# Patient Record
Sex: Female | Born: 1950 | Race: White | Hispanic: No | Marital: Married | Smoking: Never smoker
Health system: Southern US, Community
[De-identification: ages and names within clinical notes are randomized; demographics above are authoritative.]

## PROBLEM LIST (undated history)

## (undated) DIAGNOSIS — R7303 Prediabetes: Secondary | ICD-10-CM

## (undated) DIAGNOSIS — D649 Anemia, unspecified: Secondary | ICD-10-CM

## (undated) DIAGNOSIS — Z8719 Personal history of other diseases of the digestive system: Secondary | ICD-10-CM

## (undated) DIAGNOSIS — F419 Anxiety disorder, unspecified: Secondary | ICD-10-CM

## (undated) DIAGNOSIS — I10 Essential (primary) hypertension: Secondary | ICD-10-CM

## (undated) DIAGNOSIS — K219 Gastro-esophageal reflux disease without esophagitis: Secondary | ICD-10-CM

## (undated) DIAGNOSIS — R109 Unspecified abdominal pain: Secondary | ICD-10-CM

## (undated) DIAGNOSIS — F32A Depression, unspecified: Secondary | ICD-10-CM

## (undated) DIAGNOSIS — I728 Aneurysm of other specified arteries: Secondary | ICD-10-CM

## (undated) DIAGNOSIS — F329 Major depressive disorder, single episode, unspecified: Secondary | ICD-10-CM

## (undated) DIAGNOSIS — E785 Hyperlipidemia, unspecified: Secondary | ICD-10-CM

## (undated) DIAGNOSIS — M1990 Unspecified osteoarthritis, unspecified site: Secondary | ICD-10-CM

## (undated) HISTORY — DX: Anemia, unspecified: D64.9

## (undated) HISTORY — DX: Aneurysm of other specified arteries: I72.8

## (undated) HISTORY — DX: Personal history of other diseases of the digestive system: Z87.19

## (undated) HISTORY — PX: CARPAL TUNNEL RELEASE: SHX101

## (undated) HISTORY — DX: Gastro-esophageal reflux disease without esophagitis: K21.9

## (undated) HISTORY — DX: Unspecified osteoarthritis, unspecified site: M19.90

## (undated) HISTORY — DX: Essential (primary) hypertension: I10

## (undated) HISTORY — DX: Hyperlipidemia, unspecified: E78.5

## (undated) HISTORY — DX: Unspecified abdominal pain: R10.9

---

## 1979-03-18 HISTORY — PX: TUBAL LIGATION: SHX77

## 1985-03-17 HISTORY — PX: ABDOMINAL HYSTERECTOMY: SHX81

## 1997-12-01 ENCOUNTER — Other Ambulatory Visit: Admission: RE | Admit: 1997-12-01 | Discharge: 1997-12-01 | Payer: Self-pay | Admitting: Obstetrics and Gynecology

## 2000-04-02 ENCOUNTER — Other Ambulatory Visit: Admission: RE | Admit: 2000-04-02 | Discharge: 2000-04-02 | Payer: Self-pay | Admitting: Obstetrics and Gynecology

## 2002-04-15 ENCOUNTER — Encounter: Admission: RE | Admit: 2002-04-15 | Discharge: 2002-04-15 | Payer: Self-pay | Admitting: Unknown Physician Specialty

## 2002-04-15 ENCOUNTER — Encounter: Payer: Self-pay | Admitting: Unknown Physician Specialty

## 2003-04-12 ENCOUNTER — Other Ambulatory Visit: Admission: RE | Admit: 2003-04-12 | Discharge: 2003-04-12 | Payer: Self-pay | Admitting: Obstetrics and Gynecology

## 2003-09-28 ENCOUNTER — Ambulatory Visit (HOSPITAL_BASED_OUTPATIENT_CLINIC_OR_DEPARTMENT_OTHER): Admission: RE | Admit: 2003-09-28 | Discharge: 2003-09-28 | Payer: Self-pay | Admitting: Orthopedic Surgery

## 2003-09-28 ENCOUNTER — Ambulatory Visit (HOSPITAL_COMMUNITY): Admission: RE | Admit: 2003-09-28 | Discharge: 2003-09-28 | Payer: Self-pay | Admitting: Orthopedic Surgery

## 2007-05-28 ENCOUNTER — Ambulatory Visit (HOSPITAL_BASED_OUTPATIENT_CLINIC_OR_DEPARTMENT_OTHER): Admission: RE | Admit: 2007-05-28 | Discharge: 2007-05-28 | Payer: Self-pay | Admitting: Orthopedic Surgery

## 2008-07-15 IMAGING — US MAMMO-BILAT-US
1 series · 14 of 16 positions shown · non-contrast
Comparison: NONE

CLINICAL DATA: Vicenta Duce.(FEBRES)([place])   Diagnostic Mammogram. 

LEFT BREAST MAMMOGRAM ADDITIONAL VIEWS AND LEFT BREAST ULTRASOUND 
WITH  TECHNICAL REPEAT OF THE RIGHT MLO VIEW

[Series 1: us left breast · 0.06mm/px · 14 of 17 slices shown]
[im 1/17]
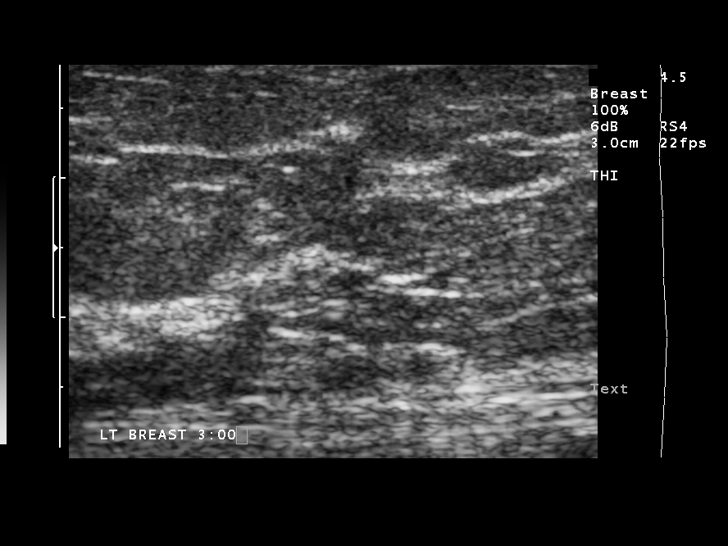
[im 2/17]
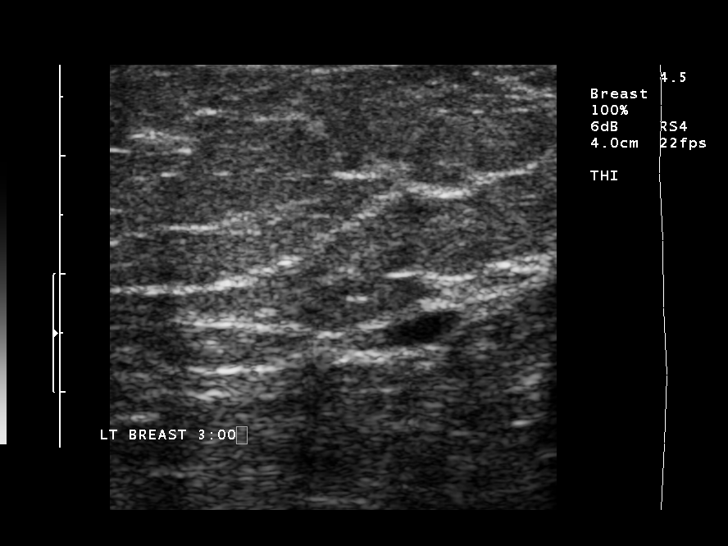
[im 3/17]
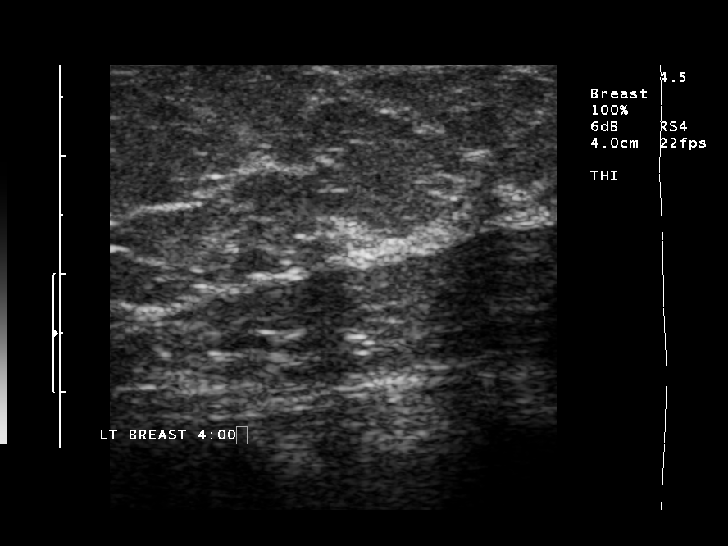
[im 5/17]
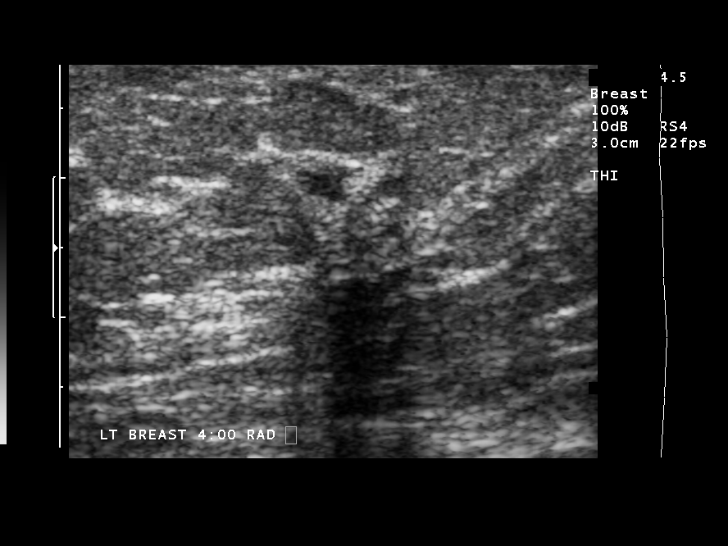
[im 6/17]
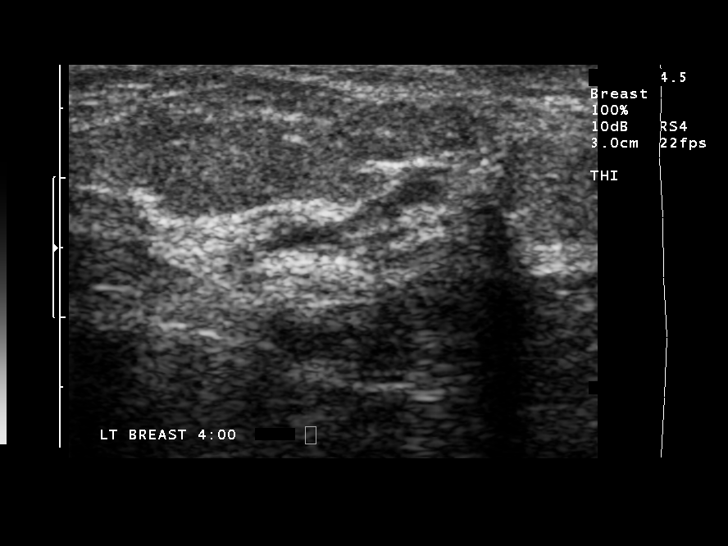
[im 7/17]
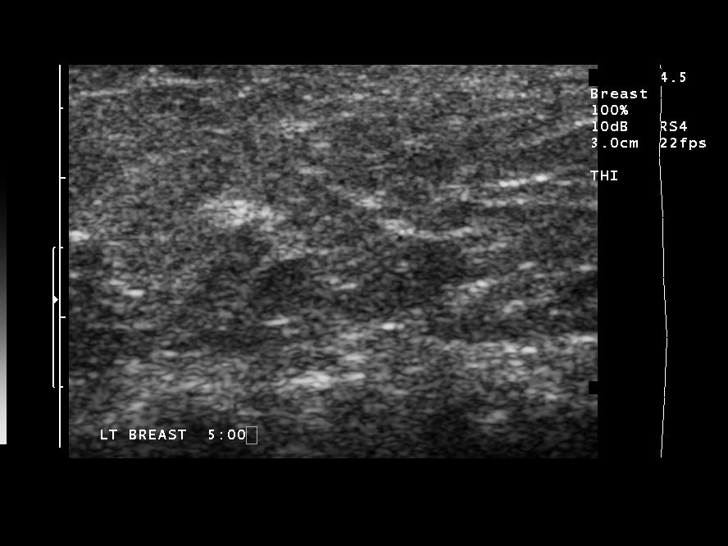
[im 8/17]
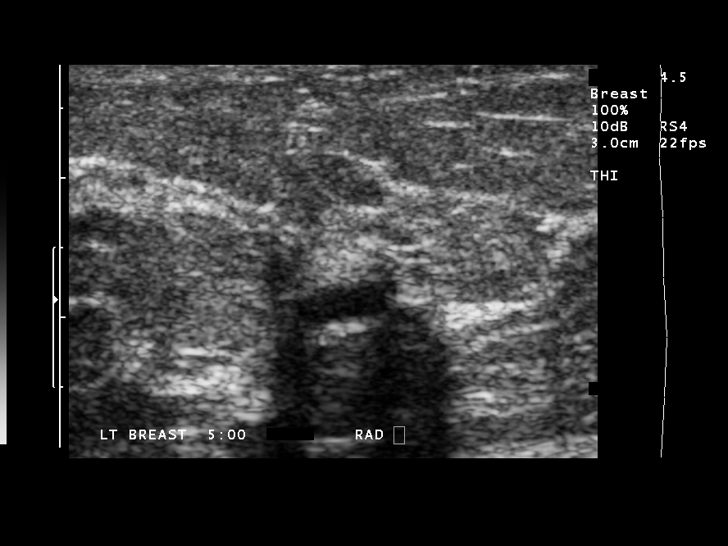
[im 9/17]
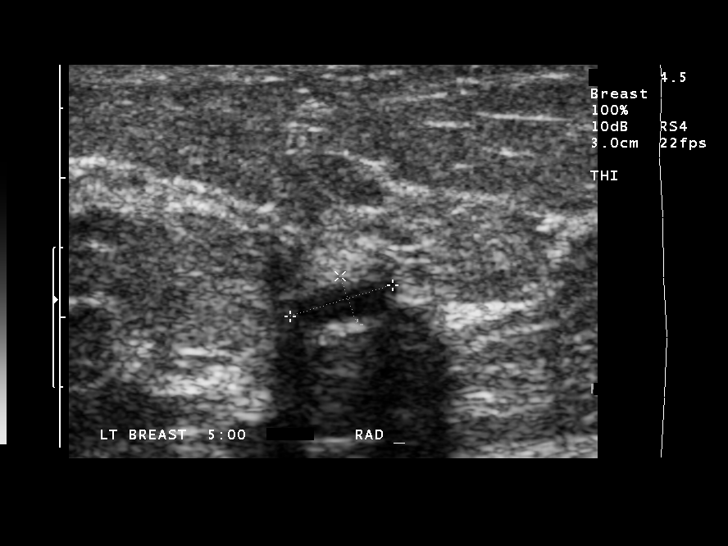
[im 10/17]
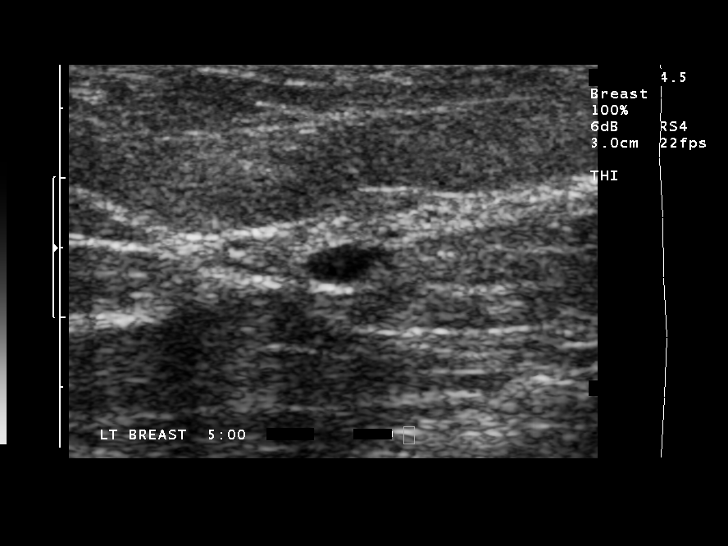
[im 11/17]
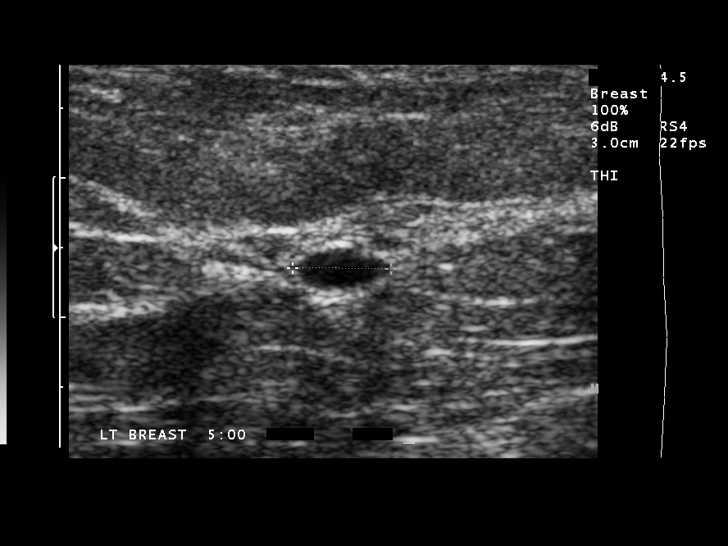
[im 13/17]
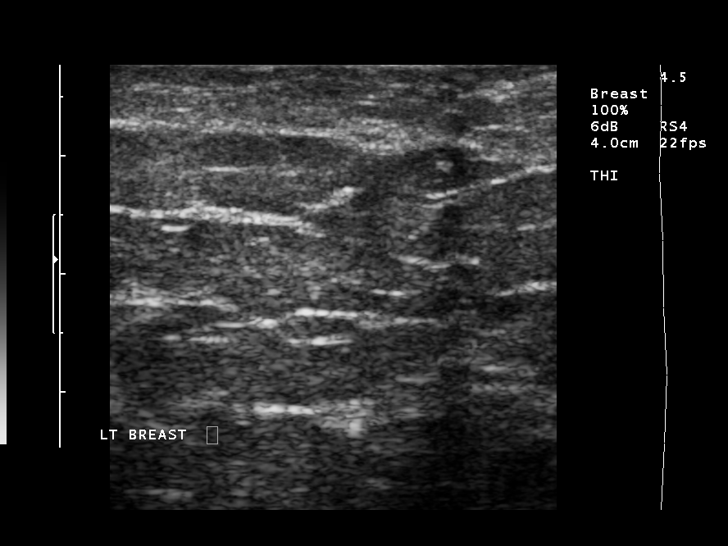
[im 14/17]
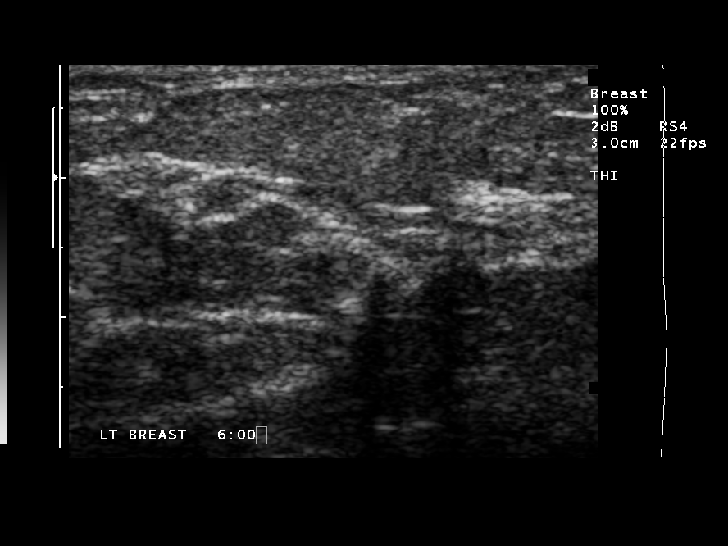
[im 15/17]
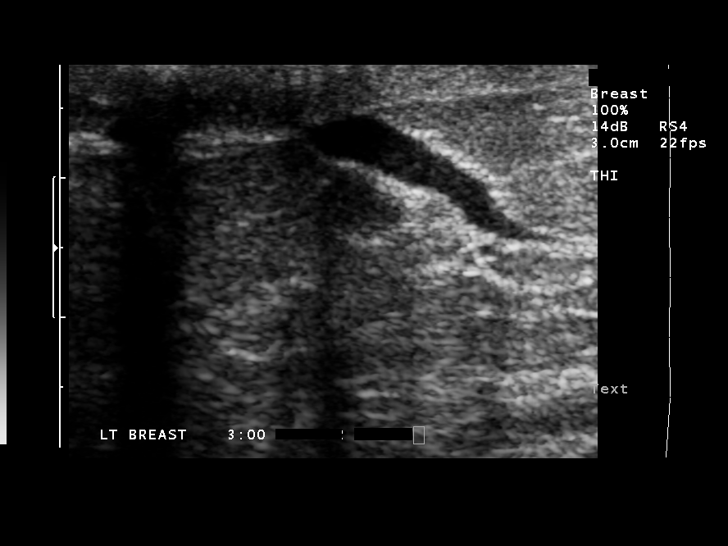
[im 17/17]
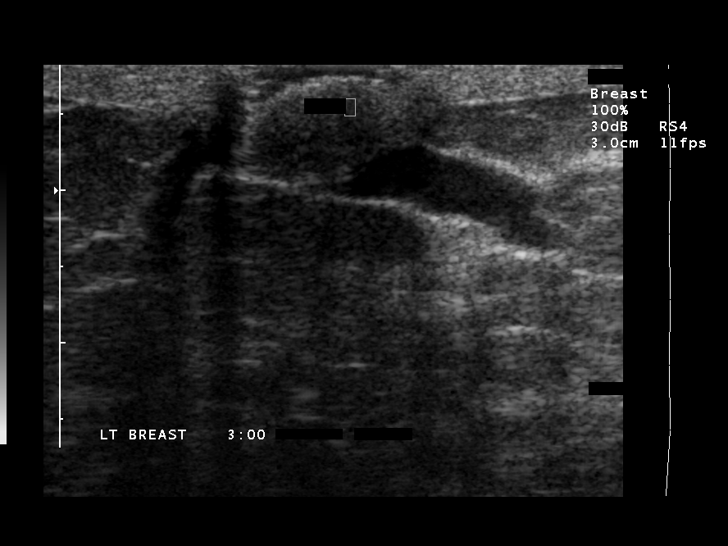

[14 of 16 positions shown; findings below may reference images not displayed]

FINDINGS: True lateral view of the left breast and repeat left 
CC view demonstrates no persistent mass, architectural distortion, 
spiculation or microcalcification.  Ultrasound demonstrates 
evidence of ductal dilatation but no evidence of suspicious solid 
or shadowing mass. Right MLO view demonstrates benign appearing 
calcification in the breast but no architectural distortion, 
spiculation or masses.
IMPRESSION: Benign changes.  No mammographic findings suspicious 
for malignancy in the right breast. Bilateral mammogram in one 
year is recommended. The patient was informed at the time of the 
examination of the findings and recommendations by verbal and 
written lay report. Computer assisted (Second Look) technology was 
used as an aid in interpretation of this study. BI-RADS 2- Benign 
Date: 04/24/2007  Tran Date:  04/27/2007 GIORGI JUMPER

## 2009-02-14 HISTORY — PX: BREAST SURGERY: SHX581

## 2009-03-13 ENCOUNTER — Ambulatory Visit (HOSPITAL_COMMUNITY): Admission: RE | Admit: 2009-03-13 | Discharge: 2009-03-13 | Payer: Self-pay | Admitting: Surgery

## 2010-03-05 IMAGING — CR DG CHEST 2V
2 series · 2 of 2 positions shown · non-contrast
Comparison: None.

CLINICAL DATA: 58-year-old female preoperative study.

CHEST - 2 VIEW

[view not recorded (1 of 2)]
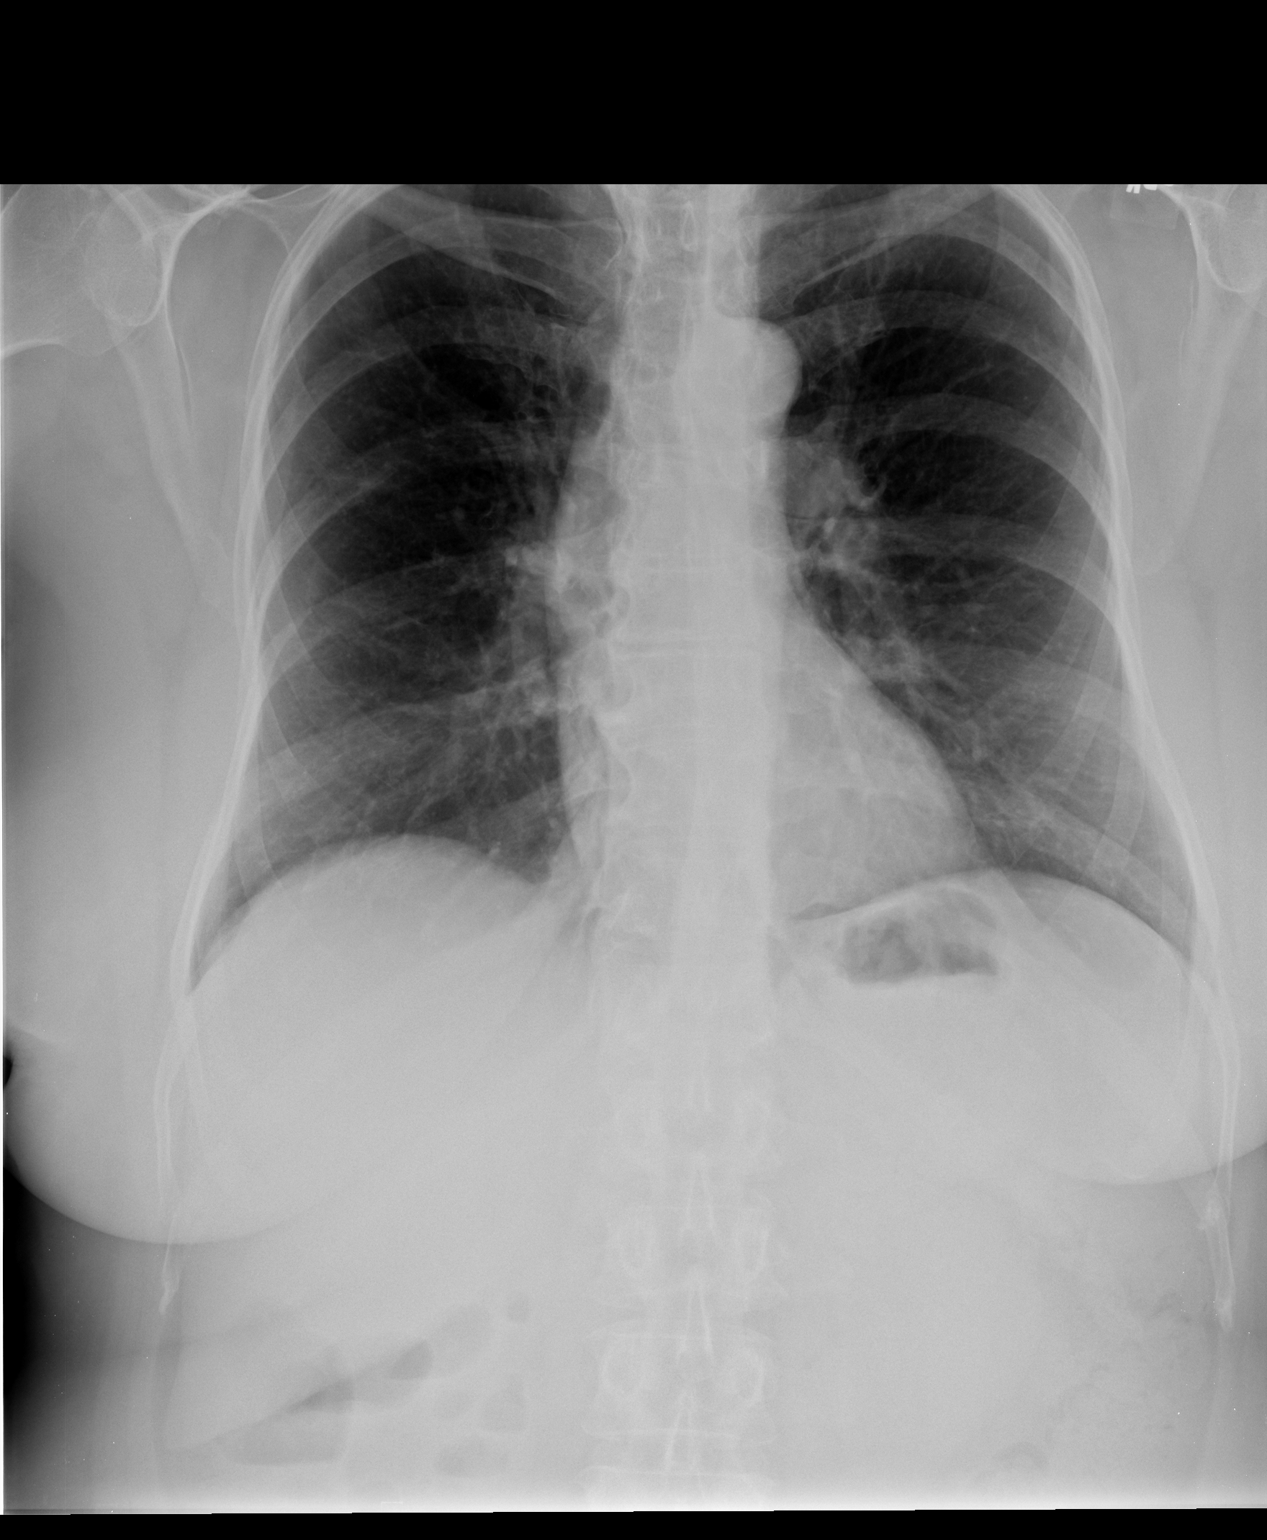

[view not recorded (2 of 2)]
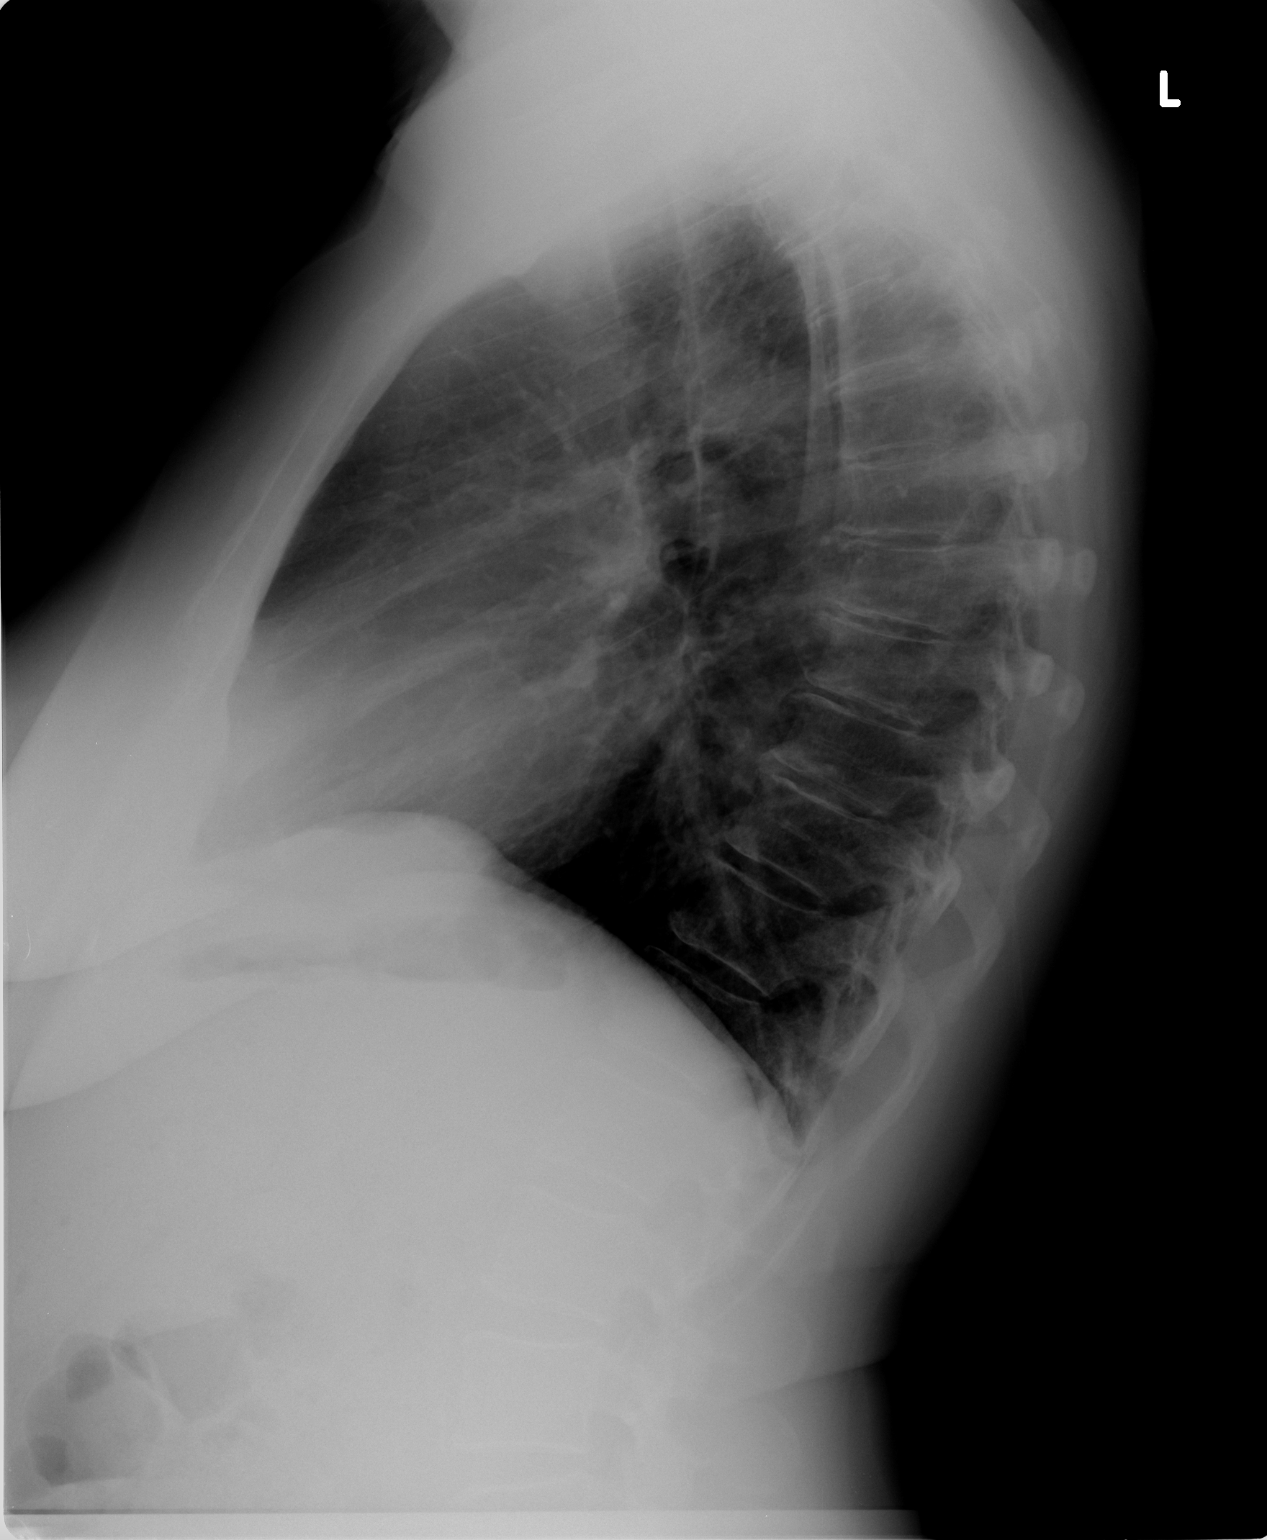

[2 of 2 positions shown; findings below may reference images not displayed]

FINDINGS: Lung volumes are within normal limits.  Cardiac size and
mediastinal contours are within normal limits.  Visualized tracheal
air column is within normal limits.  No pneumothorax, pulmonary
edema, pleural effusion, consolidation or focal airspace opacity.
No acute osseous abnormality identified.
IMPRESSION: No acute cardiopulmonary abnormality.

## 2010-03-17 HISTORY — PX: EYE SURGERY: SHX253

## 2010-06-17 LAB — DIFFERENTIAL
Basophils Absolute: 0.1 10*3/uL (ref 0.0–0.1)
Basophils Relative: 1 % (ref 0–1)
Eosinophils Absolute: 0.2 10*3/uL (ref 0.0–0.7)
Eosinophils Relative: 3 % (ref 0–5)
Lymphocytes Relative: 24 % (ref 12–46)
Lymphs Abs: 1.5 10*3/uL (ref 0.7–4.0)
Monocytes Absolute: 0.4 10*3/uL (ref 0.1–1.0)
Monocytes Relative: 6 % (ref 3–12)
Neutro Abs: 4.2 10*3/uL (ref 1.7–7.7)
Neutrophils Relative %: 66 % (ref 43–77)

## 2010-06-17 LAB — BASIC METABOLIC PANEL
BUN: 10 mg/dL (ref 6–23)
CO2: 28 mEq/L (ref 19–32)
Calcium: 9.4 mg/dL (ref 8.4–10.5)
Chloride: 104 mEq/L (ref 96–112)
Creatinine, Ser: 0.73 mg/dL (ref 0.4–1.2)
GFR calc Af Amer: >60 mL/min (ref >60)
GFR calc non Af Amer: >60 mL/min (ref >60)
Glucose, Bld: 114 mg/dL — ABNORMAL HIGH (ref 70–99)
Potassium: 3.9 mEq/L (ref 3.5–5.1)
Sodium: 140 mEq/L (ref 135–145)

## 2010-06-17 LAB — CBC
HCT: 40.9 % (ref 36.0–46.0)
Hemoglobin: 14.1 g/dL (ref 12.0–15.0)
MCHC: 34.5 g/dL (ref 30.0–36.0)
MCV: 92.2 fL (ref 78.0–100.0)
Platelets: 228 10*3/uL (ref 150–400)
RBC: 4.44 MIL/uL (ref 3.87–5.11)
RDW: 13.5 % (ref 11.5–15.5)
WBC: 6.4 10*3/uL (ref 4.0–10.5)

## 2010-07-30 NOTE — Op Note (Signed)
[name], [name]                ACCOUNT NO.:  [id number]   MEDICAL RECORD NO.:  [id number]          PATIENT TYPE:  AMB   LOCATION:  DSC                          FACILITY:  [facility]   PHYSICIAN:  Katy Fitch. Sypher, M.D. DATE OF BIRTH:  [birth year]   DATE OF PROCEDURE:  05/28/2007  DATE OF DISCHARGE:                               OPERATIVE REPORT   PREOPERATIVE DIAGNOSIS:  Entrapment neuropathy, median nerve, right  carpal tunnel.   POSTOPERATIVE DIAGNOSIS:  Entrapment neuropathy, median nerve, right  carpal tunnel.   OPERATION:  Release of right transverse carpal ligament.   OPERATING SURGEON:  Katy Fitch. Sypher, M.D., no assistant.   ANESTHESIA:  General by LMA, supervising anesthesiologist is Bedelia Person,  M.D.   INDICATIONS:  Catherine Reed is a 60 year old woman referred from her  family physician for evaluation and management of hand numbness.  Clinical examination revealed signs of entrapment neuropathy.  Electrodiagnostic studies confirmed significant median neuropathy.   Due to a failure to respond to nonoperative measures, she is brought to  the operating at this time for release of her right transverse carpal  ligament.   PROCEDURE:  Iver Fehrenbach was brought to the operating room and placed in  a supine position on the operating table.   Following the induction of general anesthesia by LMA technique, the  right arm was prepped with Betadine soap and solution, sterilely draped.  A pneumatic tourniquet was applied to the proximal right brachium.   After confirming the proper surgical site with a time-out protocol and  her allergies being noted as HYDROCHLOROTHIAZIDE and SULFA, a skin  incision was fashioned in the line of the ring finger in the palm.  The  subcutaneous tissues were carefully divided to reveal the palmar fascia.  This was split longitudinally to reveal the common sensory branch of the  median nerve and the superficial palmar arch.   The carpal canal was  sounded with a Penfield #4 elevator followed by  gentle separation of the median nerve from the transverse carpal  ligament.  The transverse carpal ligament was released subcutaneously  extending into the distal forearm.  The volar forearm fascia was  released subcutaneously.   Bleeding points were electrocauterized, followed by repair of the skin  with intradermal 3-0 Prolene suture.   A compressive dressing was applied with a volar plaster splint  maintaining the wrist in 5 degrees of dorsiflexion.   For aftercare Ms. Tomasetti is provided a prescription for Vicodin 5 mg one  p.o. q.4-6 h. p.r.n. pain, 20 tablets without refill.  I will see her  for follow-up in 1 week for suture removal and initiation of her therapy  program.      Katy Fitch. Sypher, M.D.  Electronically Signed     RVS/MEDQ  D:  05/28/2007  T:  05/29/2007  Job:  [id number]   cc:   Dr. Lorin Picket

## 2010-08-02 NOTE — Op Note (Signed)
NAME:  [name], [name]                          ACCOUNT NO.:  [id number]   MEDICAL RECORD NO.:  [id number]                   PATIENT TYPE:  AMB   LOCATION:  DSC                                  FACILITY:  [facility]   PHYSICIAN:  Katy Fitch. Naaman Plummer., M.D.          DATE OF BIRTH:  06-May-1950   DATE OF PROCEDURE:  09/28/2003  DATE OF DISCHARGE:                                 OPERATIVE REPORT   PREOPERATIVE DIAGNOSIS:  Stenosing tenosynovitis right first dorsal  compartment, status post failed nonoperative management.   POSTOPERATIVE DIAGNOSIS:  Stenosing tenosynovitis right first dorsal  compartment, status post failed nonoperative management.   OPERATION:  Release of right first dorsal compartment.   SURGEON:  Katy Fitch. Sypher, M.D.   ASSISTANT:  Marveen Reeks. Dasnoit, P.A.-C.   ANESTHESIA:  0.25% Marcaine and 1% lidocaine field block supplemented by IV  sedation.   ANESTHESIOLOGIST:  Dr. 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  This initially responded  well to splinting and anti-inflammatory medication.  She later developed  focal symptoms at the first dorsal compartment that responded x 2 to  injection with steroid and splinting.  However, upon returning to typing,  she had recurrent pain unresponsive to two injections and several episodes  of splinting.  Therefore, she is brought to the operating room at this time  for release of her right first dorsal compartment.   PROCEDURE:  [name] is brought to the operating room and placed on  supine position on the operating table.  Following light sedation, 0.25%  Marcaine and 1% plain lidocaine were infiltrated in the path of the radial  sensory branches and lateral brachiocutaneous branches.  Her sensibility was  tested and still found to be partially intact,  therefore, a field block at  the site of the intended incision was completed.  Her arm was prepped with  Betadine soaping solution and sterilely draped.  Following exsanguination of  the right arm with an Esmarch bandage, an arterial tourniquet on the  proximal brachium was inflated to 220 mmHg.  The procedure commenced with a  short incision directly over the palpably thickened first dorsal  compartment.  The subcutaneous tissues were carefully divided taking care to  gently retract the radial sensory branches.  The compartment was identified,  split with scalpel and scissors, identifying three tendons.  The compartment  was released fully proximally and distally.  There were two sizeable slips  of the adductor pollicis longus and a small slip of the extensor pollicis  brevis.  There was no septum noted.  Free range of motion of the thumb was  recovered thereafter.  The wound was then carefully repaired with  intradermal 3-0 Prolene.  A compressive dressing was applied with Xeroflow  sterile gauze  and Ace wrap.  There were no apparent complications.   For aftercare, [name] is given a prescription for Vicodin 5 mg 1-2  tablets p.o. q.4-6h. p.r.n. pain, 20 tablets without refill.  She will  return to our office for follow up in one week.                                               Katy Fitch Naaman Plummer., M.D.    RVS/MEDQ  D:  09/28/2003  T:  09/28/2003  Job:  [id number]

## 2010-12-09 LAB — BASIC METABOLIC PANEL
CO2: 26
Calcium: 9.6
GFR calc Af Amer: >60
GFR calc non Af Amer: >60
Glucose, Bld: 101 — ABNORMAL HIGH
Potassium: 3.8
Sodium: 139

## 2011-02-13 ENCOUNTER — Other Ambulatory Visit: Payer: Self-pay

## 2011-03-20 ENCOUNTER — Encounter: Payer: Self-pay | Admitting: Vascular Surgery

## 2011-03-24 ENCOUNTER — Encounter: Payer: Self-pay | Admitting: Vascular Surgery

## 2011-03-25 ENCOUNTER — Ambulatory Visit (INDEPENDENT_AMBULATORY_CARE_PROVIDER_SITE_OTHER): Payer: PRIVATE HEALTH INSURANCE | Admitting: Vascular Surgery

## 2011-03-25 ENCOUNTER — Encounter: Payer: Self-pay | Admitting: Vascular Surgery

## 2011-03-25 VITALS — BP 161/96 | HR 93 | Resp 16 | Ht 64.0 in | Wt 173.0 lb

## 2011-03-25 DIAGNOSIS — I728 Aneurysm of other specified arteries: Secondary | ICD-10-CM

## 2011-03-25 NOTE — Progress Notes (Signed)
Subjective:     Patient ID: Catherine Reed, female   DOB: 1950/12/16, 61 y.o.   MRN: [id number]  HPI this 61 year old female is referred by Dr. 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 Patient had been having lower abdominal pelvic type discomfort for several months and workup included a CT scan. This was relatively unremarkable with the exception of a calcified 9 mm splenic artery aneurysm. Patient no longer is having significant abdominal pain. She's had no nausea and vomiting. She did have diarrhea initially but that has resolved. The pain she described as an aching discomfort which lasted most of the day.  Past Medical History  Diagnosis Date  . Abdominal pain, unspecified site   . Splenic artery aneurysm   . History of IBS   . Hyperlipidemia   . Arthritis   . GERD (gastroesophageal reflux disease)   . Hypertension     History  Substance Use Topics  . Smoking status: Never Smoker   . Smokeless tobacco: Not on file  . Alcohol Use: Yes     drinks beer and wine occasionally    Family History  Problem Relation Age of Onset  . Cancer Maternal Uncle     colon cancer  . Cancer Maternal Grandmother     oral cancer  . Cancer Paternal Grandfather     bladder cancer    Allergies  Allergen Reactions  . Hctz (Hydrochlorothiazide) Itching and Rash  . Sulfamethizole Itching and Rash  . Trimethoprim Itching and Rash    Current outpatient prescriptions:Calcium Carb-Cholecalciferol (LIQUID CALCIUM WITH D3 PO), Take by mouth daily.  , Disp: , Rfl: ;  cetirizine (ZYRTEC) 10 MG tablet, Take 10 mg by mouth daily.  , Disp: , Rfl: ;  DULoxetine (CYMBALTA) 30 MG capsule, Take 30 mg by mouth daily.  , Disp: , Rfl: ;  omeprazole (PRILOSEC) 20 MG capsule, Take 20 mg by mouth daily.  , Disp: , Rfl:  simvastatin (ZOCOR) 20 MG tablet, Take 20 mg by mouth at bedtime.  , Disp: , Rfl: ;  valsartan (DIOVAN) 160 MG tablet, Take 160 mg by  mouth 2 (two) times daily.  , Disp: , Rfl: ;  aspirin 81 MG tablet, Take 81 mg by mouth daily.  , Disp: , Rfl: ;  Azelastine HCl (ASTEPRO) 0.15 % SOLN, Place into the nose 2 (two) times daily.  , Disp: , Rfl:  diclofenac-misoprostol (ARTHROTEC 75) 75-200 MG-MCG per tablet, Take 1 tablet by mouth 2 (two) times daily.  , Disp: , Rfl: ;  gabapentin (NEURONTIN) 300 MG capsule, Take 300 mg by mouth 3 (three) times daily.  , Disp: , Rfl: ;  lidocaine (LINDAMANTLE) 3 % CREA cream, Apply 1 application topically as needed.  , Disp: , Rfl:   BP 161/96  Pulse 93  Resp 16  Ht 5\' 4"  (1.626 m)  Wt 173 lb (78.472 kg)  BMI 29.70 kg/m2  SpO2 98%  Body mass index is 29.70 kg/(m^2).         Review of Systems she denies chest pain, dyspnea on exertion, PND, orthopnea, claudication, thrombophlebitis, DVT, hemispheric TIAs, amaurosis fugax,. She does have occasional diarrhea secondary to irritable bowel syndrome. Also has occasional esophageal reflux.     Objective:   Physical Exam blood pressure 161/96 heart rate 93 respirations 16 General well-developed well-nourished female in no apparent distress HEENT normal for age Lungs no rhonchi or wheezing Cardiovascular regular and no  murmurs carotid pulses 3+ no audible bruits Abdomen soft nontender no palpable mass. Neurologic normal Muscular skeletal free major deformities Skin free of rashes Lower extremity exam reveals 3+ femoral and dorsalis pedis pulses palpable bilaterally. No distal edema.  I thoroughly reviewed the report of the CT scan performed in December of 2012 at [facility] which described a 9 mm calcified splenic artery aneurysm. I do not have the CD disc to review.    Assessment:     9 mm splenic artery aneurysm-calcified    Plan:     No treatment indicated for this 9 mm splenic artery aneurysm. Recommend repeating CT scan in 2-3 years to be certain that there is no change in this 9 mm aneurysm. If it is unchanged and I do  not believe that we'll need continued surveillance

## 2011-04-22 ENCOUNTER — Encounter: Payer: Self-pay | Admitting: Vascular Surgery

## 2012-08-04 ENCOUNTER — Other Ambulatory Visit: Payer: Self-pay | Admitting: Obstetrics and Gynecology

## 2013-10-15 HISTORY — PX: OTHER SURGICAL HISTORY: SHX169

## 2015-02-26 ENCOUNTER — Ambulatory Visit: Payer: Self-pay | Admitting: Allergy and Immunology

## 2015-03-30 ENCOUNTER — Ambulatory Visit (INDEPENDENT_AMBULATORY_CARE_PROVIDER_SITE_OTHER): Payer: PRIVATE HEALTH INSURANCE | Admitting: Allergy and Immunology

## 2015-03-30 ENCOUNTER — Encounter: Payer: Self-pay | Admitting: Allergy and Immunology

## 2015-03-30 VITALS — BP 136/80 | HR 74 | Resp 16

## 2015-03-30 DIAGNOSIS — H1010 Acute atopic conjunctivitis, unspecified eye: Secondary | ICD-10-CM | POA: Diagnosis not present

## 2015-03-30 DIAGNOSIS — J309 Allergic rhinitis, unspecified: Secondary | ICD-10-CM | POA: Diagnosis not present

## 2015-03-30 DIAGNOSIS — J019 Acute sinusitis, unspecified: Secondary | ICD-10-CM | POA: Diagnosis not present

## 2015-03-30 DIAGNOSIS — J387 Other diseases of larynx: Secondary | ICD-10-CM

## 2015-03-30 DIAGNOSIS — K219 Gastro-esophageal reflux disease without esophagitis: Secondary | ICD-10-CM | POA: Insufficient documentation

## 2015-03-30 MED ORDER — CEFDINIR 300 MG PO CAPS: 300.0000 mg | ORAL_CAPSULE | Freq: Two times a day (BID) | ORAL | Status: DC

## 2015-03-30 MED ORDER — RANITIDINE HCL 300 MG PO TABS: 300.0000 mg | ORAL_TABLET | Freq: Every day | ORAL | Status: DC

## 2015-03-30 NOTE — Progress Notes (Signed)
[facility]  Follow-up Note  Referring Provider: Olive Bassough, Robert L, MD Primary Provider: Dina RichUGH,ROBERT, MD Date of Office Visit: 03/30/2015  Subjective:   Catherine DiamondMartha F Reed is a 65 y.o. female who [facility] in re-evaluation of the following:  HPI Comments:   Catherine BridgeMartha returns to this clinic on 03/30/2015 in reevaluation of her allergic rhinitis and laryngopharyngeal reflux. She's really had a good year but unfortunately sometimes during the end of December she developed an episode of sinusitis requiring an antibiotic and prednisone. She never really completely resolve this issue and still continues to have nasal congestion and feeling as though her face hurts and having ugly nasal discharge. Interestingly, while using Augmentin for her infection she also felt as though her tongue was too big and she thought she got mouth sores. This is her first flareup in a year. Her reflux is under very good control while using omeprazole twice a day. She was using a nasal steroid on a consistent basis but with her infection her nose became to roll now she she is using saline. Her pre-existing pruritus has completely resolved. She does consistently use her omeprazole and montelukast.   Current Outpatient Prescriptions on File Prior to Visit  Medication Sig Dispense Refill  . Calcium Carb-Cholecalciferol (LIQUID CALCIUM WITH D3 PO) Take by mouth daily.      . cetirizine (ZYRTEC) 10 MG tablet Take 10 mg by mouth daily.      Marland Kitchen. omeprazole (PRILOSEC) 20 MG capsule Take 20 mg by mouth daily.      . simvastatin (ZOCOR) 20 MG tablet Take 20 mg by mouth at bedtime.       No current facility-administered medications on file prior to visit.    No orders of the defined types were placed in this encounter.    Past Medical History  Diagnosis Date  . Abdominal pain, unspecified site   . Splenic artery aneurysm (HCC)   . History of  IBS   . Hyperlipidemia   . Arthritis   . GERD (gastroesophageal reflux disease)   . Hypertension   . Anemia     Past Surgical History  Procedure Laterality Date  . Eye surgery  2012    bilateral cataract  . Breast surgery  02-2009    benign tumor removed  . Abdominal hysterectomy  1987    w/ right oophorectomy  . Tubal ligation  1981  . Left foot surgery  Aug 2015    Allergies  Allergen Reactions  . Polycarbophil Swelling  . Celecoxib Nausea Only  . Hctz [Hydrochlorothiazide] Itching and Rash  . Sulfamethizole Itching and Rash  . Sulfamethoxazole-Trimethoprim Itching  . Trimethoprim Itching and Rash    Review of systems negative except as noted in HPI / PMHx or noted below:  Review of Systems  Constitutional: Negative.   HENT: Negative.   Eyes: Negative.   Respiratory: Negative.   Cardiovascular: Negative.   Gastrointestinal: Negative.   Genitourinary: Negative.   Musculoskeletal: Negative.   Skin: Negative.   Neurological: Negative.   Endo/Heme/Allergies: Negative.   Psychiatric/Behavioral: Negative.      Objective:   Filed Vitals:   03/30/15 1119  BP: 136/80  Pulse: 74  Resp: 16          Physical Exam  Constitutional: She is well-developed, well-nourished, and in no distress. No distress.  Cough, nasal voice  HENT:  Head: Normocephalic.  Right Ear: Tympanic membrane, external  ear and ear canal normal.  Left Ear: Tympanic membrane, external ear and ear canal normal.  Nose: Mucosal edema (erythematous) present. No rhinorrhea.  Mouth/Throat: Uvula is midline, oropharynx is clear and moist and mucous membranes are normal. No oropharyngeal exudate.  Eyes: Conjunctivae are normal.  Neck: Trachea normal. No tracheal tenderness present. No tracheal deviation present. No thyromegaly present.  Cardiovascular: Normal rate, regular rhythm, S1 normal, S2 normal and normal heart sounds.   No murmur heard. Pulmonary/Chest: Breath sounds normal. No stridor.  No respiratory distress. She has no wheezes. She has no rales.  Musculoskeletal: She exhibits no edema.  Lymphadenopathy:       Head (right side): No tonsillar adenopathy present.       Head (left side): No tonsillar adenopathy present.    She has no cervical adenopathy.    She has no axillary adenopathy.  Neurological: She is alert. Gait normal.  Skin: No rash noted. She is not diaphoretic. No erythema. Nails show no clubbing.  Psychiatric: Mood and affect normal.    Diagnostics: None  Assessment and Plan:   1. Allergic rhinoconjunctivitis   2. LPRD (laryngopharyngeal reflux disease)   3. Acute sinusitis, recurrence not specified, unspecified location      1.  Use nasal saline multiple times a day while "sick"  2.  Continue omeprazole and add ranitidine 300 mg in the evening while "sick"  3.  Omnicef 300 mg one tablet twice a day for 10 days  4.  Can restart over-the-counter Rhinocort one spray each nostril one time per day  5.  Can continue montelukast 10 mg one tablet one time per day  6.  Can continue Zyrtec 10 mg one tablet one time per day  7.  Further evaluation?  8.  Return to clinic in 1 year or earlier if problem   I will assume that Aysiah has a persistent sinus infection and treat her with another course of antibiotics and have her perform upper airway hygiene maneuvers at the same time she continues to use an anti-inflammatory agents and also addresses the issue with her reflux. She will let and ranitidine while she is sick given her previous history of LPR. She'll keep in contact with me noting her response to this therapy. I will assume that her "big tongue" and irritated tongue sensation that she developed recently was secondary to the amoxicillin.  Laurette Schimke, MD [facility]

## 2015-03-30 NOTE — Patient Instructions (Addendum)
  1.  Use nasal saline multiple times a day while "sick"  2.  Continue omeprazole and add ranitidine 300 mg in the evening while "sick"  3.  Omnicef 300 mg one tablet twice a day for 10 days  4.  Can restart over-the-counter Rhinocort one spray each nostril one time per day  5.  Can continue montelukast 10 mg one tablet one time per day  6.  Can continue Zyrtec 10 mg one tablet one time per day  7.  Further evaluation?  8.  Return to clinic in 1 year or earlier if problem

## 2015-06-18 ENCOUNTER — Telehealth: Payer: Self-pay | Admitting: Allergy and Immunology

## 2015-06-18 ENCOUNTER — Other Ambulatory Visit: Payer: Self-pay | Admitting: *Deleted

## 2015-06-18 MED ORDER — MONTELUKAST SODIUM 10 MG PO TABS: ORAL_TABLET | ORAL | Status: AC

## 2015-06-18 NOTE — Telephone Encounter (Signed)
RX PRINTED AND LEFT UP FRONT TO BE PICKED UP.

## 2015-06-18 NOTE — Telephone Encounter (Signed)
Needs the RX printed for montelukast for her mail in pharmacy.

## 2017-09-22 NOTE — H&P (Signed)
Anticipated LOS equal to or greater than 2 midnights due to - Age 68 and older with one or more of the following:  - Obesity  - Expected need for hospital services (PT, OT, Nursing) required for safe  discharge  - Anticipated need for postoperative skilled nursing care or inpatient rehab     Catherine Reed is an 67 y.o. female.    Chief Complaint: right knee pain  HPI: Pt is a 67 y.o. female complaining of right knee pain for multiple years. Pain had continually increased since the beginning. X-rays in the clinic show end-stage arthritic changes of the right knee. Pt has tried various conservative treatments which have failed to alleviate their symptoms, including injections and therapy. Various options are discussed with the patient. Risks, benefits and expectations were discussed with the patient. Patient understand the risks, benefits and expectations and wishes to proceed with surgery.   PCP:  Olive Bass, MD  D/C Plans: Home  PMH: Past Medical History:  Diagnosis Date  . Abdominal pain, unspecified site   . Anemia   . Arthritis   . GERD (gastroesophageal reflux disease)   . History of IBS   . Hyperlipidemia   . Hypertension   . Splenic artery aneurysm (HCC)     PSH: Past Surgical History:  Procedure Laterality Date  . ABDOMINAL HYSTERECTOMY  1987   w/ right oophorectomy  . BREAST SURGERY  02-2009   benign tumor removed  . EYE SURGERY  2012   bilateral cataract  . Left Foot Surgery  Aug 2015  . TUBAL LIGATION  1981    Social History:  reports that she has never smoked. She does not have any smokeless tobacco history on file. She reports that she drinks alcohol. She reports that she does not use drugs.  Allergies:  Allergies  Allergen Reactions  . Polycarbophil Swelling  . Celecoxib Nausea Only  . Hctz [Hydrochlorothiazide] Itching and Rash  . Sulfamethizole Itching and Rash  . Sulfamethoxazole-Trimethoprim Itching  . Trimethoprim Itching and Rash     Medications: No current facility-administered medications for this encounter.    Current Outpatient Medications  Medication Sig Dispense Refill  . Calcium Carb-Cholecalciferol (LIQUID CALCIUM WITH D3 PO) Take by mouth daily.      . cefdinir (OMNICEF) 300 MG capsule Take 1 capsule (300 mg total) by mouth 2 (two) times daily. 20 capsule 0  . cetirizine (ZYRTEC) 10 MG tablet Take 10 mg by mouth daily.      . montelukast (SINGULAIR) 10 MG tablet TAKE ONE TABLET ONCE DAILY 90 tablet 2  . omeprazole (PRILOSEC) 20 MG capsule Take 20 mg by mouth daily.      . ranitidine (ZANTAC) 300 MG tablet Take 1 tablet (300 mg total) by mouth at bedtime. 30 tablet 1  . sertraline (ZOLOFT) 100 MG tablet     . simvastatin (ZOCOR) 20 MG tablet Take 20 mg by mouth at bedtime.      . valsartan (DIOVAN) 320 MG tablet       No results found for this or any previous visit (from the past 48 hour(s)). No results found.  ROS: Pain with rom of the right lower extremity  Physical Exam: Alert and oriented 67 y.o. female in no acute distress Cranial nerves 2-12 intact Cervical spine: full rom with no tenderness, nv intact distally Chest: active breath sounds bilaterally, no wheeze rhonchi or rales Heart: regular rate and rhythm, no murmur Abd: non tender non distended with active bowel  sounds Hip is stable with rom  Right knee crepitus and pain with rom nv intact distally No rashes or edema Antalgic gait  Assessment/Plan Assessment: right knee end stage osteoarthritis  Plan:  Patient [facility] undergo [facility] right [facility]. Risks benefits and expectations were discussed with the patient. Patient understand risks, benefits and expectations and wishes to proceed. Preoperative templating of the joint replacement has been completed, documented, and submitted to the Operating Room personnel in order to optimize intra-operative equipment management.   Alphonsa OverallBrad Nehemias Sauceda PA-C, MPAS [facility]  [place] [address], [place], [place] [address] Phone: [phone] [email or web address] Facebook  [employer]  LinkedIn  Twitter

## 2017-10-09 ENCOUNTER — Encounter (HOSPITAL_COMMUNITY): Payer: Self-pay

## 2017-10-09 NOTE — Pre-Procedure Instructions (Signed)
Virl DiamondMartha F Halter  10/09/2017      [id number] - , [place] - [address] [facility] [address] [place] [address] Phone: [phone] Fax: [phone]    Your procedure is scheduled on Friday, Aug. 2nd   [facility] at 7:30 AM             (posted surgery time 9:30a - 11:45a)   Call this number if you have problems the morning of surgery:  [phone]   Remember:   Do not eat any foods or drink any liquids after midnight, Thursday.              4-5 days prior to surgery, STOP TAKING any Vitamins, Herbal Supplements, Anti-inflammatories, Blood Thinners.   Take these medicines the morning of surgery with A SIP OF WATER : Amlodipine, Hydrocodone, Omeprazole.    Do not wear jewelry, make-up or nail polish.  Do not wear lotions, powders,  perfumes, or deodorant.  Do not shave 48 hours prior to surgery.    Do not bring valuables to the hospital.  [facility] is not responsible for any belongings or valuables.  Contacts, dentures or bridgework may not be worn into surgery.  Leave your suitcase in the car.  After surgery it may be brought to your room.  For patients admitted to the hospital, discharge time will be determined by your treatment team.  Please read over the following fact sheets that you were given. Pain Booklet, MRSA Information and Surgical Site Infection Prevention      [facility]- Preparing For Surgery  Before surgery, you can play an important role. Because skin is not sterile, your skin needs to be as free of germs as possible. You can reduce the number of germs on your skin by washing with CHG (chlorahexidine gluconate) Soap before surgery.  CHG is an antiseptic cleaner which kills germs and bonds with the skin to continue killing germs even after washing.    Oral Hygiene is also important to reduce your risk of infection.    Remember - BRUSH YOUR TEETH THE MORNING OF SURGERY WITH YOUR  REGULAR TOOTHPASTE  Please do not use if you have an allergy to CHG or antibacterial soaps. If your skin becomes reddened/irritated stop using the CHG.  Do not shave (including legs and underarms) for at least 48 hours prior to first CHG shower. It is OK to shave your face.  Please follow these instructions carefully.   1. Shower the NIGHT BEFORE SURGERY and the MORNING OF SURGERY with CHG.   2. If you chose to wash your hair, wash your hair first as usual with your normal shampoo.  3. After you shampoo, rinse your hair and body thoroughly to remove the shampoo.  4. Use CHG as you would any other liquid soap. You can apply CHG directly to the skin and wash gently with a scrungie or a clean washcloth.   5. Apply the CHG Soap to your body ONLY FROM THE NECK DOWN.  Do not use on open wounds or open sores. Avoid contact with your eyes, ears, mouth and genitals (private parts). Wash Face and genitals (private parts)  with your normal soap.  6. Wash thoroughly, paying special attention to the area where your surgery will be performed.  7. Thoroughly rinse your body with warm water from the neck down.  8. DO NOT shower/wash with your normal soap after using and  rinsing off the CHG Soap.  9. Pat yourself dry with a CLEAN TOWEL.  10. Wear CLEAN PAJAMAS to bed the night before surgery, wear comfortable clothes the morning of surgery  11. Place CLEAN SHEETS on your bed the night of your first shower and DO NOT SLEEP WITH PETS.  Day of Surgery:  Do not apply any deodorants/lotions.   Please wear clean clothes to the hospital/surgery center.   Remember to brush your teeth WITH YOUR REGULAR TOOTHPASTE.

## 2017-10-12 ENCOUNTER — Encounter (HOSPITAL_COMMUNITY): Payer: Self-pay

## 2017-10-12 ENCOUNTER — Encounter (HOSPITAL_COMMUNITY)
Admission: RE | Admit: 2017-10-12 | Discharge: 2017-10-12 | Disposition: A | Discharge status: Home / Self Care | Payer: PRIVATE HEALTH INSURANCE | Source: Ambulatory Visit | Attending: Orthopedic Surgery | Admitting: Orthopedic Surgery

## 2017-10-12 ENCOUNTER — Other Ambulatory Visit: Payer: Self-pay

## 2017-10-12 DIAGNOSIS — Z01812 Encounter for preprocedural laboratory examination: Secondary | ICD-10-CM | POA: Diagnosis not present

## 2017-10-12 HISTORY — DX: Depression, unspecified: F32.A

## 2017-10-12 HISTORY — DX: Major depressive disorder, single episode, unspecified: F32.9

## 2017-10-12 HISTORY — DX: Prediabetes: R73.03

## 2017-10-12 HISTORY — DX: Anxiety disorder, unspecified: F41.9

## 2017-10-12 HISTORY — DX: Personal history of other diseases of the digestive system: Z87.19

## 2017-10-12 LAB — SURGICAL PCR SCREEN
MRSA, PCR: NEGATIVE
Staphylococcus aureus: NEGATIVE

## 2017-10-12 LAB — CBC
HCT: 40.3 % (ref 36.0–46.0)
HEMOGLOBIN: 12.7 g/dL (ref 12.0–15.0)
MCH: 28.7 pg (ref 26.0–34.0)
MCHC: 31.5 g/dL (ref 30.0–36.0)
MCV: 91 fL (ref 78.0–100.0)
Platelets: 269 10*3/uL (ref 150–400)
RBC: 4.43 MIL/uL (ref 3.87–5.11)
RDW: 14.2 % (ref 11.5–15.5)
WBC: 8.2 10*3/uL (ref 4.0–10.5)

## 2017-10-12 LAB — BASIC METABOLIC PANEL
ANION GAP: 7 (ref 5–15)
BUN: 10 mg/dL (ref 8–23)
CALCIUM: 9.1 mg/dL (ref 8.9–10.3)
CHLORIDE: 106 mmol/L (ref 98–111)
CO2: 25 mmol/L (ref 22–32)
Creatinine, Ser: 0.68 mg/dL (ref 0.44–1.00)
GFR calc Af Amer: >60 mL/min (ref >60)
GFR calc non Af Amer: >60 mL/min (ref >60)
GLUCOSE: 130 mg/dL — AB (ref 70–99)
Potassium: 3.7 mmol/L (ref 3.5–5.1)
Sodium: 138 mmol/L (ref 135–145)

## 2017-10-12 NOTE — Progress Notes (Signed)
PCP - Sterling Bigeinaldo Nodal, PA-C; Irena Reichmannana Collins, PA Cardiologist - denies  Chest x-ray - N/A EKG - 07/09/17 Stress Test - denies ECHO - denies Cardiac Cath - denies  Sleep Study -denies  Aspirin Instructions: N/A  Anesthesia review: No.   Patient denies shortness of breath, fever, cough and chest pain at PAT appointment   Patient verbalized understanding of instructions that were given to them at the PAT appointment. Patient was also instructed that they will need to review over the PAT instructions again at home before surgery.

## 2017-10-15 MED ORDER — TRANEXAMIC ACID 1000 MG/10ML IV SOLN
1000.0000 mg | INTRAVENOUS | Status: AC
  Administered 2017-10-16: 1000 mg via INTRAVENOUS
  Filled 2017-10-15: qty 1100

## 2017-10-15 MED ORDER — CEFAZOLIN SODIUM-DEXTROSE 2-4 GM/100ML-% IV SOLN
2.0000 g | INTRAVENOUS | Status: AC
  Administered 2017-10-16: 2 g via INTRAVENOUS
  Filled 2017-10-15: qty 100

## 2017-10-16 ENCOUNTER — Encounter (HOSPITAL_COMMUNITY): Payer: Self-pay

## 2017-10-16 ENCOUNTER — Inpatient Hospital Stay (HOSPITAL_COMMUNITY): Payer: PRIVATE HEALTH INSURANCE | Admitting: Anesthesiology

## 2017-10-16 ENCOUNTER — Encounter (HOSPITAL_COMMUNITY)
Admission: RE | Disposition: A | Discharge status: Home Health | Payer: Self-pay | Source: Home / Self Care | Attending: Orthopedic Surgery

## 2017-10-16 ENCOUNTER — Inpatient Hospital Stay (HOSPITAL_COMMUNITY)
Admission: RE | Admit: 2017-10-16 | Discharge: 2017-10-18 | DRG: 470 | Disposition: A | Discharge status: Home Health | Payer: PRIVATE HEALTH INSURANCE | Attending: Orthopedic Surgery | Admitting: Orthopedic Surgery

## 2017-10-16 ENCOUNTER — Other Ambulatory Visit: Payer: Self-pay

## 2017-10-16 DIAGNOSIS — M1711 Unilateral primary osteoarthritis, right knee: Secondary | ICD-10-CM | POA: Diagnosis present

## 2017-10-16 DIAGNOSIS — I10 Essential (primary) hypertension: Secondary | ICD-10-CM | POA: Diagnosis present

## 2017-10-16 DIAGNOSIS — E785 Hyperlipidemia, unspecified: Secondary | ICD-10-CM | POA: Diagnosis present

## 2017-10-16 DIAGNOSIS — K219 Gastro-esophageal reflux disease without esophagitis: Secondary | ICD-10-CM | POA: Diagnosis present

## 2017-10-16 DIAGNOSIS — Z96651 Presence of right artificial knee joint: Secondary | ICD-10-CM

## 2017-10-16 HISTORY — PX: TOTAL KNEE ARTHROPLASTY: SHX125

## 2017-10-16 SURGERY — ARTHROPLASTY, KNEE, TOTAL
Anesthesia: Monitor Anesthesia Care | Laterality: Right

## 2017-10-16 MED ORDER — FERROUS SULFATE 325 (65 FE) MG PO TABS
325.0000 mg | ORAL_TABLET | Freq: Three times a day (TID) | ORAL | Status: DC
  Administered 2017-10-16 – 2017-10-18 (×5): 325 mg via ORAL
  Filled 2017-10-16 (×5): qty 1

## 2017-10-16 MED ORDER — PHENYLEPHRINE HCL 10 MG/ML IJ SOLN
INTRAMUSCULAR | Status: DC | PRN
  Administered 2017-10-16: 10 ug/min via INTRAVENOUS

## 2017-10-16 MED ORDER — MESALAMINE 1.2 G PO TBEC
4.8000 g | DELAYED_RELEASE_TABLET | Freq: Every day | ORAL | Status: DC
  Administered 2017-10-17 – 2017-10-18 (×2): 4.8 g via ORAL
  Filled 2017-10-16 (×2): qty 4

## 2017-10-16 MED ORDER — ROPIVACAINE HCL 7.5 MG/ML IJ SOLN
INTRAMUSCULAR | Status: DC | PRN
  Administered 2017-10-16: 20 mL via PERINEURAL

## 2017-10-16 MED ORDER — HYDROCODONE-ACETAMINOPHEN 5-325 MG PO TABS
1.0000 | ORAL_TABLET | Freq: Four times a day (QID) | ORAL | Status: DC | PRN
Start: 2017-10-16 — End: 2017-10-18
  Administered 2017-10-16 – 2017-10-18 (×6): 1 via ORAL
  Filled 2017-10-16 (×5): qty 1

## 2017-10-16 MED ORDER — PHENOL 1.4 % MT LIQD: 1.0000 | OROMUCOSAL | Status: DC | PRN

## 2017-10-16 MED ORDER — ASPIRIN 81 MG PO CHEW: 81.0000 mg | CHEWABLE_TABLET | Freq: Two times a day (BID) | ORAL | 0 refills | Status: AC

## 2017-10-16 MED ORDER — DIPHENOXYLATE-ATROPINE 2.5-0.025 MG PO TABS: 1.0000 | ORAL_TABLET | Freq: Two times a day (BID) | ORAL | Status: DC | PRN

## 2017-10-16 MED ORDER — MIDAZOLAM HCL 2 MG/2ML IJ SOLN
1.0000 mg | Freq: Once | INTRAMUSCULAR | Status: AC
  Administered 2017-10-16: 1 mg via INTRAVENOUS

## 2017-10-16 MED ORDER — SODIUM CHLORIDE 0.9 % IV SOLN
INTRAVENOUS | Status: DC
  Administered 2017-10-16: 15:00:00 via INTRAVENOUS

## 2017-10-16 MED ORDER — DOCUSATE SODIUM 100 MG PO CAPS
100.0000 mg | ORAL_CAPSULE | Freq: Two times a day (BID) | ORAL | Status: DC
  Administered 2017-10-17 – 2017-10-18 (×3): 100 mg via ORAL
  Filled 2017-10-16 (×4): qty 1

## 2017-10-16 MED ORDER — METHOCARBAMOL 500 MG PO TABS
500.0000 mg | ORAL_TABLET | Freq: Four times a day (QID) | ORAL | Status: DC | PRN
  Administered 2017-10-16 – 2017-10-18 (×6): 500 mg via ORAL
  Filled 2017-10-16 (×5): qty 1

## 2017-10-16 MED ORDER — METHOCARBAMOL 500 MG PO TABS
ORAL_TABLET | ORAL | Status: AC
  Administered 2017-10-16: 500 mg via ORAL
  Filled 2017-10-16: qty 1

## 2017-10-16 MED ORDER — MONTELUKAST SODIUM 10 MG PO TABS
10.0000 mg | ORAL_TABLET | Freq: Every day | ORAL | Status: DC
  Administered 2017-10-17 – 2017-10-18 (×2): 10 mg via ORAL
  Filled 2017-10-16 (×2): qty 1

## 2017-10-16 MED ORDER — HYDROMORPHONE HCL 1 MG/ML IJ SOLN
0.2500 mg | INTRAMUSCULAR | Status: DC | PRN
  Administered 2017-10-16 (×2): 0.5 mg via INTRAVENOUS

## 2017-10-16 MED ORDER — SERTRALINE HCL 100 MG PO TABS
100.0000 mg | ORAL_TABLET | Freq: Every evening | ORAL | Status: DC
  Administered 2017-10-16 – 2017-10-17 (×2): 100 mg via ORAL
  Filled 2017-10-16 (×2): qty 1

## 2017-10-16 MED ORDER — CHLORHEXIDINE GLUCONATE 4 % EX LIQD: 60.0000 mL | Freq: Once | CUTANEOUS | Status: DC

## 2017-10-16 MED ORDER — CEFAZOLIN SODIUM-DEXTROSE 2-4 GM/100ML-% IV SOLN
2.0000 g | Freq: Four times a day (QID) | INTRAVENOUS | Status: AC
  Administered 2017-10-16: 2 g via INTRAVENOUS
  Filled 2017-10-16 (×2): qty 100

## 2017-10-16 MED ORDER — BUPIVACAINE IN DEXTROSE 0.75-8.25 % IT SOLN
INTRATHECAL | Status: DC | PRN
  Administered 2017-10-16: 13.5 mg via INTRATHECAL

## 2017-10-16 MED ORDER — HYDROMORPHONE HCL 1 MG/ML IJ SOLN
INTRAMUSCULAR | Status: AC
  Administered 2017-10-16: 0.5 mg via INTRAVENOUS
  Filled 2017-10-16: qty 1

## 2017-10-16 MED ORDER — METOCLOPRAMIDE HCL 5 MG/ML IJ SOLN: 5.0000 mg | Freq: Three times a day (TID) | INTRAMUSCULAR | Status: DC | PRN

## 2017-10-16 MED ORDER — ONDANSETRON HCL 4 MG PO TABS: 4.0000 mg | ORAL_TABLET | Freq: Four times a day (QID) | ORAL | Status: DC | PRN

## 2017-10-16 MED ORDER — AMLODIPINE BESYLATE 5 MG PO TABS
5.0000 mg | ORAL_TABLET | Freq: Every day | ORAL | Status: DC
  Administered 2017-10-17: 5 mg via ORAL
  Filled 2017-10-16 (×2): qty 1

## 2017-10-16 MED ORDER — FENTANYL CITRATE (PF) 250 MCG/5ML IJ SOLN
INTRAMUSCULAR | Status: AC
  Filled 2017-10-16: qty 5

## 2017-10-16 MED ORDER — METHOCARBAMOL 500 MG PO TABS: 500.0000 mg | ORAL_TABLET | Freq: Three times a day (TID) | ORAL | 1 refills | Status: AC | PRN

## 2017-10-16 MED ORDER — POLYETHYLENE GLYCOL 3350 17 G PO PACK: 17.0000 g | PACK | Freq: Every day | ORAL | Status: DC | PRN

## 2017-10-16 MED ORDER — ONDANSETRON HCL 4 MG/2ML IJ SOLN
4.0000 mg | Freq: Four times a day (QID) | INTRAMUSCULAR | Status: DC | PRN
  Administered 2017-10-17: 4 mg via INTRAVENOUS
  Filled 2017-10-16: qty 2

## 2017-10-16 MED ORDER — BISACODYL 10 MG RE SUPP: 10.0000 mg | Freq: Every day | RECTAL | Status: DC | PRN

## 2017-10-16 MED ORDER — METHOCARBAMOL 1000 MG/10ML IJ SOLN
500.0000 mg | Freq: Four times a day (QID) | INTRAVENOUS | Status: DC | PRN
  Filled 2017-10-16: qty 5

## 2017-10-16 MED ORDER — OXYCODONE HCL 5 MG PO TABS
5.0000 mg | ORAL_TABLET | ORAL | Status: DC | PRN
  Administered 2017-10-18: 10 mg via ORAL
  Filled 2017-10-16: qty 2

## 2017-10-16 MED ORDER — OXYCODONE-ACETAMINOPHEN 5-325 MG PO TABS: 1.0000 | ORAL_TABLET | ORAL | 0 refills | Status: AC | PRN

## 2017-10-16 MED ORDER — METOCLOPRAMIDE HCL 5 MG PO TABS: 5.0000 mg | ORAL_TABLET | Freq: Three times a day (TID) | ORAL | Status: DC | PRN

## 2017-10-16 MED ORDER — FENTANYL CITRATE (PF) 100 MCG/2ML IJ SOLN
50.0000 ug | Freq: Once | INTRAMUSCULAR | Status: AC
  Administered 2017-10-16: 50 ug via INTRAVENOUS

## 2017-10-16 MED ORDER — 0.9 % SODIUM CHLORIDE (POUR BTL) OPTIME
TOPICAL | Status: DC | PRN
  Administered 2017-10-16: 1000 mL

## 2017-10-16 MED ORDER — LORATADINE 10 MG PO TABS
10.0000 mg | ORAL_TABLET | Freq: Every day | ORAL | Status: DC
  Administered 2017-10-17 – 2017-10-18 (×2): 10 mg via ORAL
  Filled 2017-10-16 (×2): qty 1

## 2017-10-16 MED ORDER — DEXAMETHASONE SODIUM PHOSPHATE 10 MG/ML IJ SOLN
INTRAMUSCULAR | Status: DC | PRN
  Administered 2017-10-16: 5 mg via INTRAVENOUS

## 2017-10-16 MED ORDER — ASPIRIN 81 MG PO CHEW
81.0000 mg | CHEWABLE_TABLET | Freq: Two times a day (BID) | ORAL | Status: DC
  Administered 2017-10-16 – 2017-10-18 (×4): 81 mg via ORAL
  Filled 2017-10-16 (×4): qty 1

## 2017-10-16 MED ORDER — HYDROCODONE-ACETAMINOPHEN 5-325 MG PO TABS
ORAL_TABLET | ORAL | Status: AC
  Administered 2017-10-16: 1 via ORAL
  Filled 2017-10-16: qty 1

## 2017-10-16 MED ORDER — MENTHOL 3 MG MT LOZG: 1.0000 | LOZENGE | OROMUCOSAL | Status: DC | PRN

## 2017-10-16 MED ORDER — ADULT MULTIVITAMIN W/MINERALS CH
1.0000 | ORAL_TABLET | Freq: Every day | ORAL | Status: DC
  Filled 2017-10-16: qty 1

## 2017-10-16 MED ORDER — ONDANSETRON HCL 4 MG/2ML IJ SOLN
INTRAMUSCULAR | Status: DC | PRN
  Administered 2017-10-16: 4 mg via INTRAVENOUS

## 2017-10-16 MED ORDER — ACETAMINOPHEN 325 MG PO TABS: 325.0000 mg | ORAL_TABLET | Freq: Four times a day (QID) | ORAL | Status: DC | PRN

## 2017-10-16 MED ORDER — LACTATED RINGERS IV SOLN
INTRAVENOUS | Status: DC
  Administered 2017-10-16: 08:00:00 via INTRAVENOUS

## 2017-10-16 MED ORDER — MIDAZOLAM HCL 2 MG/2ML IJ SOLN
INTRAMUSCULAR | Status: AC
  Administered 2017-10-16: 1 mg via INTRAVENOUS
  Filled 2017-10-16: qty 2

## 2017-10-16 MED ORDER — PANTOPRAZOLE SODIUM 40 MG PO TBEC
40.0000 mg | DELAYED_RELEASE_TABLET | Freq: Every day | ORAL | Status: DC
  Administered 2017-10-17 – 2017-10-18 (×2): 40 mg via ORAL
  Filled 2017-10-16 (×2): qty 1

## 2017-10-16 MED ORDER — IRBESARTAN 300 MG PO TABS
300.0000 mg | ORAL_TABLET | Freq: Every day | ORAL | Status: DC
  Administered 2017-10-17 – 2017-10-18 (×2): 300 mg via ORAL
  Filled 2017-10-16 (×2): qty 1

## 2017-10-16 MED ORDER — CALCIUM CARBONATE-VITAMIN D 500-200 MG-UNIT PO TABS
2.0000 | ORAL_TABLET | Freq: Every day | ORAL | Status: DC
  Administered 2017-10-16 – 2017-10-17 (×2): 2 via ORAL
  Filled 2017-10-16 (×2): qty 2

## 2017-10-16 MED ORDER — PROPOFOL 500 MG/50ML IV EMUL
INTRAVENOUS | Status: DC | PRN
  Administered 2017-10-16: 100 ug/kg/min via INTRAVENOUS

## 2017-10-16 MED ORDER — SODIUM CHLORIDE 0.9 % IV SOLN
1000.0000 mg | Freq: Once | INTRAVENOUS | Status: DC
  Filled 2017-10-16: qty 10

## 2017-10-16 MED ORDER — PROPOFOL 10 MG/ML IV BOLUS
INTRAVENOUS | Status: AC
  Filled 2017-10-16: qty 20

## 2017-10-16 MED ORDER — SIMVASTATIN 20 MG PO TABS
20.0000 mg | ORAL_TABLET | Freq: Every day | ORAL | Status: DC
  Administered 2017-10-16 – 2017-10-17 (×2): 20 mg via ORAL
  Filled 2017-10-16 (×2): qty 1

## 2017-10-16 MED ORDER — FENTANYL CITRATE (PF) 100 MCG/2ML IJ SOLN
INTRAMUSCULAR | Status: AC
  Administered 2017-10-16: 50 ug via INTRAVENOUS
  Filled 2017-10-16: qty 2

## 2017-10-16 MED ORDER — HYDROMORPHONE HCL 1 MG/ML IJ SOLN
0.5000 mg | INTRAMUSCULAR | Status: DC | PRN
  Administered 2017-10-16 – 2017-10-17 (×5): 1 mg via INTRAVENOUS
  Filled 2017-10-16 (×5): qty 1

## 2017-10-16 SURGICAL SUPPLY — 65 items
BANDAGE ESMARK 6X9 LF (GAUZE/BANDAGES/DRESSINGS) ×1 IMPLANT
BLADE SAG 18X100X1.27 (BLADE) ×3 IMPLANT
BLADE SAW SGTL 13X75X1.27 (BLADE) ×3 IMPLANT
BNDG CMPR 9X6 STRL LF SNTH (GAUZE/BANDAGES/DRESSINGS) ×1
BNDG CMPR MED 10X6 ELC LF (GAUZE/BANDAGES/DRESSINGS) ×1
BNDG ELASTIC 6X10 VLCR STRL LF (GAUZE/BANDAGES/DRESSINGS) ×3 IMPLANT
BNDG ESMARK 6X9 LF (GAUZE/BANDAGES/DRESSINGS) ×3
BNDG GAUZE ELAST 4 BULKY (GAUZE/BANDAGES/DRESSINGS) ×4 IMPLANT
BOWL SMART MIX CTS (DISPOSABLE) ×3 IMPLANT
CEMENT HV SMART SET (Cement) ×6 IMPLANT
CEMENT TIBIA MBT (Knees) IMPLANT
CLOSURE WOUND 1/2 X4 (GAUZE/BANDAGES/DRESSINGS) ×2
COVER SURGICAL LIGHT HANDLE (MISCELLANEOUS) ×3 IMPLANT
CUFF TOURNIQUET SINGLE 34IN LL (TOURNIQUET CUFF) IMPLANT
CUFF TOURNIQUET SINGLE 44IN (TOURNIQUET CUFF) IMPLANT
DRAPE EXTREMITY T 121X128X90 (DRAPE) ×3 IMPLANT
DRAPE HALF SHEET 40X57 (DRAPES) ×3 IMPLANT
DRAPE U-SHAPE 47X51 STRL (DRAPES) ×3 IMPLANT
DRSG ADAPTIC 3X8 NADH LF (GAUZE/BANDAGES/DRESSINGS) ×3 IMPLANT
DRSG PAD ABDOMINAL 8X10 ST (GAUZE/BANDAGES/DRESSINGS) ×6 IMPLANT
DURAPREP 26ML APPLICATOR (WOUND CARE) ×3 IMPLANT
ELECT CAUTERY BLADE 6.4 (BLADE) ×3 IMPLANT
ELECT REM PT RETURN 9FT ADLT (ELECTROSURGICAL) ×3
ELECTRODE REM PT RTRN 9FT ADLT (ELECTROSURGICAL) ×1 IMPLANT
FEMUR SIGMA PS SZ 3.0 R (Femur) ×2 IMPLANT
GAUZE SPONGE 4X4 12PLY STRL (GAUZE/BANDAGES/DRESSINGS) ×3 IMPLANT
GLOVE BIOGEL PI IND STRL 7.0 (GLOVE) IMPLANT
GLOVE BIOGEL PI INDICATOR 7.0 (GLOVE) ×2
GLOVE BIOGEL PI ORTHO PRO 7.5 (GLOVE) ×2
GLOVE BIOGEL PI ORTHO PRO SZ8 (GLOVE) ×2
GLOVE ORTHO TXT STRL SZ7.5 (GLOVE) ×3 IMPLANT
GLOVE PI ORTHO PRO STRL 7.5 (GLOVE) ×1 IMPLANT
GLOVE PI ORTHO PRO STRL SZ8 (GLOVE) ×1 IMPLANT
GLOVE SURG ORTHO 8.5 STRL (GLOVE) ×3 IMPLANT
GLOVE SURG SS PI 7.0 STRL IVOR (GLOVE) ×2 IMPLANT
GOWN STRL REUS W/ TWL XL LVL3 (GOWN DISPOSABLE) ×3 IMPLANT
GOWN STRL REUS W/TWL XL LVL3 (GOWN DISPOSABLE) ×9
HANDPIECE INTERPULSE COAX TIP (DISPOSABLE) ×3
IMMOBILIZER KNEE 22 UNIV (SOFTGOODS) ×2 IMPLANT
KIT BASIN OR (CUSTOM PROCEDURE TRAY) ×3 IMPLANT
KIT MANIFOLD (MISCELLANEOUS) ×3 IMPLANT
KIT TURNOVER KIT B (KITS) ×3 IMPLANT
MANIFOLD NEPTUNE II (INSTRUMENTS) ×3 IMPLANT
NS IRRIG 1000ML POUR BTL (IV SOLUTION) ×3 IMPLANT
PACK TOTAL JOINT (CUSTOM PROCEDURE TRAY) ×3 IMPLANT
PAD ABD 8X10 STRL (GAUZE/BANDAGES/DRESSINGS) ×2 IMPLANT
PAD ARMBOARD 7.5X6 YLW CONV (MISCELLANEOUS) ×6 IMPLANT
PATELLA DOME PFC 38MM (Knees) ×2 IMPLANT
PLATE ROT INSERT 12.5MM SIZE 3 (Plate) ×2 IMPLANT
SET HNDPC FAN SPRY TIP SCT (DISPOSABLE) ×1 IMPLANT
STRIP CLOSURE SKIN 1/2X4 (GAUZE/BANDAGES/DRESSINGS) ×4 IMPLANT
SUCTION FRAZIER HANDLE 10FR (MISCELLANEOUS) ×2
SUCTION TUBE FRAZIER 10FR DISP (MISCELLANEOUS) ×1 IMPLANT
SUT MNCRL AB 3-0 PS2 18 (SUTURE) ×3 IMPLANT
SUT VIC AB 0 CT1 27 (SUTURE) ×6
SUT VIC AB 0 CT1 27XBRD ANBCTR (SUTURE) ×2 IMPLANT
SUT VIC AB 1 CT1 27 (SUTURE) ×9
SUT VIC AB 1 CT1 27XBRD ANBCTR (SUTURE) ×3 IMPLANT
SUT VIC AB 2-0 CT1 27 (SUTURE) ×6
SUT VIC AB 2-0 CT1 TAPERPNT 27 (SUTURE) ×2 IMPLANT
TIBIA MBT CEMENT (Knees) ×3 IMPLANT
TOWEL OR 17X24 6PK STRL BLUE (TOWEL DISPOSABLE) ×3 IMPLANT
TOWEL OR 17X26 10 PK STRL BLUE (TOWEL DISPOSABLE) ×3 IMPLANT
TRAY CATH 16FR W/PLASTIC CATH (SET/KITS/TRAYS/PACK) IMPLANT
TRAY FOLEY MTR SLVR 16FR STAT (SET/KITS/TRAYS/PACK) IMPLANT

## 2017-10-16 NOTE — Anesthesia Preprocedure Evaluation (Addendum)
Anesthesia Evaluation  Patient identified by MRN, date of birth, ID band Patient awake    Reviewed: Allergy & Precautions, H&P , NPO status , Patient's Chart, lab work & pertinent test results  Airway Mallampati: I  TM Distance: >3 FB Neck ROM: Full    Dental no notable dental hx. (+) Teeth Intact, Dental Advisory Given   Pulmonary neg pulmonary ROS,    Pulmonary exam normal breath sounds clear to auscultation       Cardiovascular hypertension, Pt. on medications  Rhythm:Regular Rate:Normal     Neuro/Psych Anxiety Depression negative neurological ROS     GI/Hepatic Neg liver ROS, hiatal hernia, GERD  Controlled and Medicated,  Endo/Other  negative endocrine ROS  Renal/GU negative Renal ROS  negative genitourinary   Musculoskeletal  (+) Arthritis , Osteoarthritis,    Abdominal   Peds  Hematology negative hematology ROS (+) anemia ,   Anesthesia Other Findings   Reproductive/Obstetrics negative OB ROS                           Anesthesia Physical Anesthesia Plan  ASA: II  Anesthesia Plan: Spinal and MAC   Post-op Pain Management:  Regional for Post-op pain   Induction: Intravenous  PONV Risk Score and Plan: 3 and Ondansetron, Dexamethasone, Propofol infusion and Midazolam  Airway Management Planned: Simple Face Mask  Additional Equipment:   Intra-op Plan:   Post-operative Plan:   Informed Consent: I have reviewed the patients History and Physical, chart, labs and discussed the procedure including the risks, benefits and alternatives for the proposed anesthesia with the patient or authorized representative who has indicated his/her understanding and acceptance.   Dental advisory given  Plan Discussed with: CRNA  Anesthesia Plan Comments:         Anesthesia Quick Evaluation

## 2017-10-16 NOTE — Brief Op Note (Signed)
10/16/2017  12:29 PM  PATIENT:  Catherine Reed  67 y.o. female  PRE-OPERATIVE DIAGNOSIS:  right knee end stage osteoarthritis  POST-OPERATIVE DIAGNOSIS:  right knee end stage osteoarthritis  PROCEDURE:  Procedure(s): TOTAL KNEE ARTHROPLASTY (Right) DePuy Sigma RP  SURGEON:  Surgeon(s) and Role:    Beverely Low* Zorion Nims, MD - Primary  PHYSICIAN ASSISTANT:   ASSISTANTS: Thea Gisthomas B Dixon, PA-C   ANESTHESIA:   regional and spinal  EBL:  50 mL   BLOOD ADMINISTERED:none  DRAINS: none   LOCAL MEDICATIONS USED:  NONE  SPECIMEN:  No Specimen  DISPOSITION OF SPECIMEN:  N/A  COUNTS:  YES  TOURNIQUET:   Total Tourniquet Time Documented: Thigh (Right) - 89 minutes Total: Thigh (Right) - 89 minutes   DICTATION: .Other Dictation: Dictation Number [id number]  PLAN OF CARE: Admit to inpatient   PATIENT DISPOSITION:  PACU - hemodynamically stable.   Delay start of Pharmacological VTE agent (>24hrs) due to surgical blood loss or risk of bleeding: no

## 2017-10-16 NOTE — Evaluation (Signed)
Physical Therapy Evaluation Patient Details Name: Virl DiamondMartha F Nabers MRN: [id number] DOB: 1950-08-10 Today's Date: 10/16/2017   History of Present Illness  Bunnie PhilipsMartha Dugar is a 67 y.o. F s/p R TKR. PMH includes OA, Arthritis, GERD, HLD, HTN, Splenic Artery Aneurysm, Benign Breast Tumor + Removal, Hiatal Hernia, Anxiety, and Depression.  Clinical Impression  Pt presents with problems above and deficits below. Pt and spouse educated on precautions and supine HEP. Pt performed bed mobility with Supervision, and sit<>stand transfers with MinA. Pt ambulated with RW and MinG. Will continue to follow acutely to support independence, mobility, and safety.     Follow Up Recommendations Follow surgeon's recommendation for DC plan and follow-up therapies;Supervision for mobility/OOB    Equipment Recommendations  3in1 (PT)    Recommendations for Other Services OT consult     Precautions / Restrictions Precautions Precautions: Knee Precaution Booklet Issued: Yes (comment) Precaution Comments: Pt educated on precautions and supine HEP Required Braces or Orthoses: Knee Immobilizer - Right Knee Immobilizer - Right: On except when in CPM Restrictions Weight Bearing Restrictions: Yes RLE Weight Bearing: Weight bearing as tolerated      Mobility  Bed Mobility Overal bed mobility: Needs Assistance Bed Mobility: Supine to Sit     Supine to sit: Supervision     General bed mobility comments: Pt required Supervision for bed mobility. PT assisted by managing lines and leads.   Transfers Overall transfer level: Needs assistance Equipment used: Rolling walker (2 wheeled) Transfers: Sit to/from Stand Sit to Stand: Min assist         General transfer comment: Pt required MinA for initial power up of standing, and performed transfer with flexed trunk, but was able to straighten once standing. Pt had no visible LOB and reported no dizziness, but had some unsteadiness while standing. Pt required cues  for safe hand placement of RW.   Ambulation/Gait Ambulation/Gait assistance: Min guard Gait Distance (Feet): 20 Feet Assistive device: Rolling walker (2 wheeled) Gait Pattern/deviations: Step-to pattern;Decreased stance time - right;Decreased step length - left;Decreased stride length;Decreased weight shift to right;Trunk flexed;Antalgic Gait velocity: decreased   General Gait Details: Pt ambulated to door and back with RW and MinG. Pt reported some fatigue and pain during ambulation, but had no visible LOB. Pt responded to cues to stand up tall, but had a tendency to look down during gait.   Stairs            Wheelchair Mobility    Modified Rankin (Stroke Patients Only)       Balance Overall balance assessment: Needs assistance Sitting-balance support: Feet supported;No upper extremity supported Sitting balance-Leahy Scale: Fair Sitting balance - Comments: Pt was able to raise arms overhead while PT put on gait belt.    Standing balance support: Bilateral upper extremity supported Standing balance-Leahy Scale: Poor Standing balance comment: Pt had some unsteadiness with standing, and was reliant on RW for balance.                             Pertinent Vitals/Pain Pain Assessment: 0-10 Pain Score: 6  Pain Location: R Knee Pain Descriptors / Indicators: Grimacing;Guarding Pain Intervention(s): Limited activity within patient's tolerance;Monitored during session;Repositioned    Home Living Family/patient expects to be discharged to:: Private residence Living Arrangements: Spouse/significant other Available Help at Discharge: Family;Available 24 hours/day(Pt reports husband can be there 24/7 for weekend) Type of Home: House Home Access: Stairs to enter Entrance Stairs-Rails: None;Left(No rails on front,  L rail on side door) Entrance Stairs-Number of Steps: 2 on the front, 2 larger stairs on side door with a L rail Home Layout: One level Home Equipment:  Walker - 2 wheels;Cane - single point;Cane - quad;Hand held shower head;Grab bars - tub/shower;Shower seat Additional Comments: Pt reports she plans on using the front door, unless PT recommends using the side door with rails.     Prior Function Level of Independence: Independent;Independent with assistive device(s)         Comments: Pt reports using cane for uneven ground or longer distances     Hand Dominance   Dominant Hand: Right    Extremity/Trunk Assessment   Upper Extremity Assessment Upper Extremity Assessment: Defer to OT evaluation    Lower Extremity Assessment Lower Extremity Assessment: RLE deficits/detail RLE Deficits / Details: Pain and limitations as expected s/p R TKR. Pt reported pain, but no numbness or tingling. RLE Sensation: WNL    Cervical / Trunk Assessment Cervical / Trunk Assessment: Kyphotic  Communication   Communication: No difficulties  Cognition Arousal/Alertness: Awake/alert Behavior During Therapy: WFL for tasks assessed/performed Overall Cognitive Status: Within Functional Limits for tasks assessed                                 General Comments: Pt is pleasant      General Comments General comments (skin integrity, edema, etc.): Pt's husband, Billey Gosling, was in the room.     Exercises Total Joint Exercises Ankle Circles/Pumps: AROM;Both;20 reps;Supine Quad Sets: AROM;Right;10 reps;Supine Heel Slides: AROM;Right;10 reps;Supine   Assessment/Plan    PT Assessment Patient needs continued PT services  PT Problem List Decreased strength;Decreased range of motion;Decreased activity tolerance;Decreased balance;Decreased mobility;Decreased knowledge of use of DME;Decreased knowledge of precautions;Pain       PT Treatment Interventions DME instruction;Gait training;Stair training;Functional mobility training;Therapeutic activities;Therapeutic exercise;Balance training;Patient/family education    PT Goals (Current goals can  be found in the Care Plan section)  Acute Rehab PT Goals Patient Stated Goal: To get on the floor and play with grandchildren PT Goal Formulation: With patient Time For Goal Achievement: 10/30/17 Potential to Achieve Goals: Good    Frequency 7X/week   Barriers to discharge        Co-evaluation               AM-PAC PT "6 Clicks" Daily Activity  Outcome Measure Difficulty turning over in bed (including adjusting bedclothes, sheets and blankets)?: A Little Difficulty moving from lying on back to sitting on the side of the bed? : A Little Difficulty sitting down on and standing up from a chair with arms (e.g., wheelchair, bedside commode, etc,.)?: Unable Help needed moving to and from a bed to chair (including a wheelchair)?: A Little Help needed walking in hospital room?: A Little Help needed climbing 3-5 steps with a railing? : A Lot 6 Click Score: 15    End of Session Equipment Utilized During Treatment: Gait belt;Right knee immobilizer Activity Tolerance: Patient limited by fatigue Patient left: in chair;with call bell/phone within reach;with chair alarm set;with family/visitor present Nurse Communication: Mobility status PT Visit Diagnosis: Unsteadiness on feet (R26.81);Other abnormalities of gait and mobility (R26.89);Muscle weakness (generalized) (M62.81);Difficulty in walking, not elsewhere classified (R26.2);Pain Pain - Right/Left: Right Pain - part of body: Knee    Time: 1610-9604 PT Time Calculation (min) (ACUTE ONLY): 34 min   Charges:   PT Evaluation $PT Eval Low Complexity: 1 Low PT  Treatments $Therapeutic Activity: 8-22 mins   Demetria Pore, S-DPT Acute Care Rehab Student [phone]     10/16/2017, 4:26 PM

## 2017-10-16 NOTE — Anesthesia Procedure Notes (Signed)
Spinal  Patient location during procedure: OR Start time: 10/16/2017 10:26 AM End time: 10/16/2017 10:29 AM Staffing Anesthesiologist: Gaynelle AduFitzgerald, Jashae Wiggs, MD Performed: anesthesiologist  Preanesthetic Checklist Completed: patient identified, surgical consent, pre-op evaluation, timeout performed, IV checked, risks and benefits discussed and monitors and equipment checked Spinal Block Patient position: sitting Prep: DuraPrep Patient monitoring: cardiac monitor, continuous pulse ox and blood pressure Approach: midline Location: L3-4 Injection technique: single-shot Needle Needle type: Pencan  Needle gauge: 24 G Needle length: 9 cm Assessment Sensory level: T8 Additional Notes Functioning IV was confirmed and monitors were applied. Sterile prep and drape, including hand hygiene and sterile gloves were used. The patient was positioned and the spine was prepped. The skin was anesthetized with lidocaine.  Free flow of clear CSF was obtained prior to injecting local anesthetic into the CSF.  The spinal needle aspirated freely following injection.  The needle was carefully withdrawn.  The patient tolerated the procedure well.

## 2017-10-16 NOTE — Progress Notes (Signed)
Orthopedic Tech Progress Note Patient Details:  Catherine Reed 11-30-1950 [id number]  CPM Right Knee CPM Right Knee: On Right Knee Flexion (Degrees): 60 Right Knee Extension (Degrees): 0 Additional Comments: foot roll  Post Interventions Patient Tolerated: Well Instructions Provided: Care of device  Saul FordyceJennifer C Isaiah Torok 10/16/2017, 1:28 PM

## 2017-10-16 NOTE — Discharge Instructions (Signed)
Ok to put full weight on the right leg.  Prop under the ankle to elevate the leg NOT under the knee.  Do exercises every hour for 5-10 minutes.  Wear the black knee brace at night to keep the knee straight while you sleep.  Ice to the knee constantly.  Keep the incision clean and dry and covered for one week, then ok to get it wet in the shower.  Follow up with Dr Ranell PatrickNorris in two weeks in the office, [phone]

## 2017-10-16 NOTE — Anesthesia Postprocedure Evaluation (Signed)
Anesthesia Post Note  Patient: Catherine Reed  Procedure(s) Performed: TOTAL KNEE ARTHROPLASTY (Right )     Patient location during evaluation: PACU Anesthesia Type: MAC and Regional Level of consciousness: oriented and awake and alert Pain management: pain level controlled Vital Signs Assessment: post-procedure vital signs reviewed and stable Respiratory status: spontaneous breathing, respiratory function stable and patient connected to nasal cannula oxygen Cardiovascular status: blood pressure returned to baseline and stable Postop Assessment: no headache, no backache, no apparent nausea or vomiting, spinal receding and patient able to bend at knees Anesthetic complications: no    Last Vitals:  Vitals:   10/16/17 1415 10/16/17 1432  BP:  135/78  Pulse: 79 76  Resp: 10 18  Temp: (!) 36.3 C 36.8 C  SpO2: 95% 97%    Last Pain:  Vitals:   10/16/17 1432  TempSrc: Oral  PainSc:                  Kyrah Schiro,W. EDMOND

## 2017-10-16 NOTE — Anesthesia Procedure Notes (Signed)
Procedure Name: MAC Date/Time: 10/16/2017 10:32 AM Performed by: Kyung Rudd, CRNA Pre-anesthesia Checklist: Patient identified, Emergency Drugs available, Suction available and Patient being monitored Patient Re-evaluated:Patient Re-evaluated prior to induction Oxygen Delivery Method: Simple face mask Induction Type: IV induction Placement Confirmation: positive ETCO2 Dental Injury: Teeth and Oropharynx as per pre-operative assessment

## 2017-10-16 NOTE — Interval H&P Note (Signed)
History and Physical Interval Note:  10/16/2017 9:55 AM  Catherine Reed  has presented today for surgery, with the diagnosis of right knee end stage osteoarthritis  The various methods of treatment have been discussed with the patient and family. After consideration of risks, benefits and other options for treatment, the patient has consented to  Procedure(s): TOTAL KNEE ARTHROPLASTY (Right) as a surgical intervention .  The patient's history has been reviewed, patient examined, no change in status, stable for surgery.  I have reviewed the patient's chart and labs.  Questions were answered to the patient's satisfaction.     Lazarius Rivkin,STEVEN R

## 2017-10-16 NOTE — Transfer of Care (Signed)
Immediate Anesthesia Transfer of Care Note  Patient: Catherine DiamondMartha F Valdivia  Procedure(s) Performed: TOTAL KNEE ARTHROPLASTY (Right )  Patient Location: PACU  Anesthesia Type:Spinal  Level of Consciousness: awake, alert  and oriented  Airway & Oxygen Therapy: Patient Spontanous Breathing and Patient connected to face mask oxygen  Post-op Assessment: Report given to RN and Post -op Vital signs reviewed and stable  Post vital signs: Reviewed and stable  Last Vitals:  Vitals Value Taken Time  BP 112/70 10/16/2017 12:25 PM  Temp    Pulse 74 10/16/2017 12:26 PM  Resp 17 10/16/2017 12:26 PM  SpO2 100 % 10/16/2017 12:26 PM  Vitals shown include unvalidated device data.  Last Pain:  Vitals:   10/16/17 0756  TempSrc: Oral  PainSc: 0-No pain         Complications: No apparent anesthesia complications

## 2017-10-16 NOTE — Op Note (Signed)
NAME: Catherine Reed, Catherine Reed MEDICAL RECORD ZO:[id number] ACCOUNT [id number] DATE OF BIRTH:[birth year] FACILITY: [facility] LOCATION: [facility] PHYSICIAN:STEVEN Russ Halo, MD  OPERATIVE REPORT  DATE OF PROCEDURE:  10/16/2017  PREOPERATIVE DIAGNOSIS:  Right knee end-stage osteoarthritis.  POSTOPERATIVE DIAGNOSIS:  Right knee end-stage osteoarthritis.  PROCEDURE PERFORMED:  Right total knee arthroplasty using DePuy Sigma rotating platform prosthesis.  ATTENDING SURGEON:  Malon Kindle, MD  ASSISTANT:  Modesto Charon, [place], who was scrubbed during the entire procedure and necessary for satisfactory completion of surgery.  ANESTHESIA:  Spinal anesthesia plus an adductor canal block was used.  ESTIMATED BLOOD LOSS:  Less than 100 mL.  FLUID REPLACEMENT:  1500 mL crystalloid.  INSTRUMENT COUNTS:  Correct.  COMPLICATIONS:  None.  ANTIBIOTICS:  Perioperative antibiotics were given.  INDICATIONS:  The patient is a 67 year old female with worsening right knee pain secondary to end-stage arthritis.  The patient has bone-on-bone findings on standing x-rays.  She has had progressive pain despite conservative management.  Desires  operative total knee replacement to restore function, mechanical alignment and to eliminate pain in the knee.  Informed consent obtained.  DESCRIPTION OF PROCEDURE:  After an adequate level of anesthesia was achieved, the patient was positioned supine on the operating room table.  Right leg correctly identified.  Nonsterile tourniquet placed on proximal thigh.  Right leg sterilely prepped  and draped in the usual manner.  Timeout called.  We exsanguinated the limb using Esmarch bandage, elevated the tourniquet to 300 mmHg, placed the knee in flexion.  A longitudinal midline incision created with a 10 blade scalpel.  Dissection down through  subcutaneous tissues.  A medial parapatellar arthrotomy was created with a fresh 10 blade scalpel.  Lateral patellofemoral ligament was  divided.  The patella was everted.  We entered the distal femur, which had complete cartilage loss on the medial  femoral condyle and the patellofemoral joint.  We entered the distal femur with a step cut drill.  We then placed our intramedullary resection guide and resected 10 mm off the right distal femur set on 5 degrees right.  We next went and sized the femur,  size 3 anterior down and performed our anterior, posterior and chamfer cuts with a 4-in-1 block.  We removed ACL, PCL, meniscal tissues subluxed the tibia anteriorly and performed our tibial cut 90 degrees perpendicular to the long axis of the tibia with  a 4 mm off the affected medial side.  We were pleased with our resections on the femur and the tibia.  We checked our gaps, which were symmetric at 10 mm, both in flexion and extension.  We removed excess posterior osteophytes off the posterior femur  with a lamina spreader and an osteotome.  We then completed our tibial preparation with the modular drill and keel punch and placed a size 3 trial tibia in place and then we did the box cut for the posterior cruciate substituting prosthesis for the femur  with a box cut guide and placed our 3 right femur in place and impacted that.  We then took a 10 poly spacer and put that in place that on the tibial side was reduced the tibia back on the femur.  We were able to get the knee to full extension if not a  little bit of hyperextension.  We had good stable knee both in flexion and extension.  The isometry was very good.  At this point, we resurfaced the patella going from 25 mm thickness down to a 16 mm  thickness with the patellar clamp.  We then drilled  our lug holes for the 38 patella.  We placed our trial 38 patella onto the posterior aspect of the patella so with the patellar button in place, we reduced the patella and then ranged the knee.  We had wonderful tracking with no-touch technique.  We  removed all trial components, pulse irrigated and  dried the bone well.  We vacuum mixed cement on the back table and cemented the components into place.  A size 3 tibia, size 3 right femur and the 38 patellar button.  We used a patellar clamp to hold the  button until the cement was hardened and we also placed the knee in extension with the 10 mm poly spacer trial until the cement was hardened.  We removed excess cement with quarter inch curved osteotome.  We irrigated the knee fully.  We trialled with  the 12.5 and felt like that gave just slightly improved flexion stability, although it was already good.  We selected the real size 3, 12.5 poly placed on the tibia and after thorough irrigation reduced the knee.  We had nice little pop in the medial  femoral condyle reducing and ranged the knee.  Had full range of motion and excellent patellar tracking.  We irrigated well and closed the parapatellar arthrotomy with #1 Vicryl suture followed by 2-0 Vicryl for subcutaneous closure and 4-0 Monocryl for  skin.  Steri-Strips applied followed by sterile dressing.  The patient tolerated surgery well.  TN/NUANCE  D:10/16/2017 T:10/16/2017 JOB:[id number]

## 2017-10-16 NOTE — Plan of Care (Signed)

## 2017-10-16 NOTE — Anesthesia Procedure Notes (Signed)
Anesthesia Regional Block: Adductor canal block   Pre-Anesthetic Checklist: ,, timeout performed, Correct Patient, Correct Site, Correct Laterality, Correct Procedure, Correct Position, site marked, Risks and benefits discussed, pre-op evaluation,  At surgeon's request and post-op pain management  Laterality: Right  Prep: Maximum Sterile Barrier Precautions used, chloraprep       Needles:  Injection technique: Single-shot  Needle Type: Echogenic Stimulator Needle     Needle Length: 9cm  Needle Gauge: 21     Additional Needles:   Procedures:,,,, ultrasound used (permanent image in chart),,,,  Narrative:  Start time: 10/16/2017 8:44 AM End time: 10/16/2017 8:54 AM Injection made incrementally with aspirations every 5 mL.  Performed by: Personally  Anesthesiologist: Gaynelle AduFitzgerald, Antwane Grose, MD  Additional Notes: 2% Lidocaine skin wheel.

## 2017-10-17 LAB — BASIC METABOLIC PANEL
Anion gap: 8 (ref 5–15)
BUN: 10 mg/dL (ref 8–23)
CALCIUM: 9.2 mg/dL (ref 8.9–10.3)
CO2: 25 mmol/L (ref 22–32)
CREATININE: 0.62 mg/dL (ref 0.44–1.00)
Chloride: 102 mmol/L (ref 98–111)
GFR calc non Af Amer: >60 mL/min (ref >60)
Glucose, Bld: 119 mg/dL — ABNORMAL HIGH (ref 70–99)
Potassium: 3.9 mmol/L (ref 3.5–5.1)
SODIUM: 135 mmol/L (ref 135–145)

## 2017-10-17 LAB — CBC
HCT: 33 % — ABNORMAL LOW (ref 36.0–46.0)
Hemoglobin: 10.5 g/dL — ABNORMAL LOW (ref 12.0–15.0)
MCH: 28.2 pg (ref 26.0–34.0)
MCHC: 31.8 g/dL (ref 30.0–36.0)
MCV: 88.7 fL (ref 78.0–100.0)
PLATELETS: 239 10*3/uL (ref 150–400)
RBC: 3.72 MIL/uL — ABNORMAL LOW (ref 3.87–5.11)
RDW: 14.2 % (ref 11.5–15.5)
WBC: 12.6 10*3/uL — ABNORMAL HIGH (ref 4.0–10.5)

## 2017-10-17 NOTE — Evaluation (Signed)
Occupational Therapy Evaluation Patient Details Name: Catherine Reed MRN: [id number] DOB: Sep 22, 1950 Today's Date: 10/17/2017    History of Present Illness Catherine Reed is a 67 y.o. F s/p R TKR. PMH includes OA, Arthritis, GERD, HLD, HTN, Splenic Artery Aneurysm, Benign Breast Tumor + Removal, Hiatal Hernia, Anxiety, and Depression.   Clinical Impression   Pt with decline in function and safety with ADLs and ADL mobility with decreased balance and endurance. PTA, pt lived at home with her husband and was independent with ADLs, driving and used cane outside. Pt will have 24/7 assist at home from her spouse. Pt and husband educated on ADL mobility safety using reacher for LB ADLs/safety, DME and shower safety with built in shower seat they have at home. Pt would benefit from acute OT services to address impairments to maximize level of function and safety    Follow Up Recommendations  Follow surgeon's recommendation for DC plan and follow-up therapies;Supervision - Intermittent    Equipment Recommendations  3 in 1 bedside commode;Other (comment)(reacher)    Recommendations for Other Services       Precautions / Restrictions Precautions Precautions: Knee Precaution Booklet Issued: Yes (comment) Precaution Comments: reviewd no pillow under knee Restrictions Weight Bearing Restrictions: Yes RLE Weight Bearing: Weight bearing as tolerated      Mobility Bed Mobility Overal bed mobility: Needs Assistance Bed Mobility: Supine to Sit     Supine to sit: Supervision     General bed mobility comments: Returing from restroom with OT on arrival  Transfers Overall transfer level: Needs assistance Equipment used: Rolling walker (2 wheeled) Transfers: Sit to/from Stand Sit to Stand: Min assist         General transfer comment: min guard for safety and cues for hand placement when performing sit<>stand from bed and toilet    Balance Overall balance assessment: Needs  assistance Sitting-balance support: Feet supported;No upper extremity supported Sitting balance-Leahy Scale: Fair Sitting balance - Comments: Pt was able to raise arms overhead while PT put on gait belt.    Standing balance support: Bilateral upper extremity supported;During functional activity Standing balance-Leahy Scale: Poor Standing balance comment: reliant on UE support                           ADL either performed or assessed with clinical judgement   ADL Overall ADL's : Needs assistance/impaired                                             Vision Baseline Vision/History: Wears glasses Patient Visual Report: No change from baseline       Perception     Praxis      Pertinent Vitals/Pain Pain Assessment: 0-10 Pain Score: 4  Pain Location: R Knee Pain Descriptors / Indicators: Grimacing;Guarding Pain Intervention(s): Limited activity within patient's tolerance;Repositioned;Premedicated before session;Monitored during session     Hand Dominance Right   Extremity/Trunk Assessment Upper Extremity Assessment Upper Extremity Assessment: Overall WFL for tasks assessed   Lower Extremity Assessment Lower Extremity Assessment: Defer to PT evaluation       Communication Communication Communication: No difficulties   Cognition Arousal/Alertness: Awake/alert Behavior During Therapy: WFL for tasks assessed/performed Overall Cognitive Status: Within Functional Limits for tasks assessed  General Comments  Husband present for session    Exercises Exercises: Total Joint Total Joint Exercises Short Arc Quad: AROM;Right;10 reps;Seated Hip ABduction/ADduction: AROM;Right;10 reps;Seated Straight Leg Raises: AROM;Right;10 reps;Seated   Shoulder Instructions      Home Living Family/patient expects to be discharged to:: Private residence Living Arrangements: Spouse/significant  other Available Help at Discharge: Family;Available 24 hours/day Type of Home: House Home Access: Stairs to enter [employer] of Steps: 2 on the front, 2 larger stairs on side door with a L rail Entrance Stairs-Rails: None;Left Home Layout: One level     Bathroom Shower/Tub: Producer, television/film/video: Handicapped height Bathroom Accessibility: Yes   Home Equipment: Environmental consultant - 2 wheels;Cane - single point;Cane - quad;Hand held shower head;Grab bars - tub/shower;Shower seat   Additional Comments: Pt reports she plans on using the front door, unless PT recommends using the side door with rails.       Prior Functioning/Environment Level of Independence: Independent;Independent with assistive device(s)        Comments: Pt reports using cane for uneven ground or longer distances        OT Problem List: Decreased activity tolerance;Decreased knowledge of use of DME or AE;Impaired balance (sitting and/or standing);Pain      OT Treatment/Interventions:      OT Goals(Current goals can be found in the care plan section) Acute Rehab OT Goals Patient Stated Goal: To get on the floor and play with grandchildren OT Goal Formulation: With patient/family Time For Goal Achievement: 10/24/17 Potential to Achieve Goals: Good ADL Goals Pt Will Perform Grooming: with set-up;with supervision;standing;with caregiver independent in assisting Pt Will Perform Lower Body Bathing: with min guard assist;with caregiver independent in assisting Pt Will Perform Lower Body Dressing: with min guard assist Pt Will Transfer to Toilet: with supervision;ambulating Pt Will Perform Toileting - Clothing Manipulation and hygiene: with supervision;with caregiver independent in assisting;sit to/from stand Pt Will Perform Tub/Shower Transfer: with min guard assist;with supervision;ambulating;shower seat;3 in 1;grab bars;rolling walker  OT Frequency:     Barriers to D/C:             Co-evaluation              AM-PAC PT "6 Clicks" Daily Activity     Outcome Measure Help from another person eating meals?: None Help from another person taking care of personal grooming?: A Little Help from another person toileting, which includes using toliet, bedpan, or urinal?: A Little Help from another person bathing (including washing, rinsing, drying)?: A Little Help from another person to put on and taking off regular upper body clothing?: None Help from another person to put on and taking off regular lower body clothing?: A Little 6 Click Score: 20   End of Session Equipment Utilized During Treatment: Gait belt;Rolling walker;Other (comment)(3 in 1) CPM Right Knee CPM Right Knee: Off  Activity Tolerance: Patient tolerated treatment well Patient left: Other (comment)(ambulating with PT)  OT Visit Diagnosis: Unsteadiness on feet (R26.81);Other abnormalities of gait and mobility (R26.89);Muscle weakness (generalized) (M62.81);Pain Pain - Right/Left: Right Pain - part of body: Knee                Time: 1914-7829 OT Time Calculation (min): 24 min Charges:  OT General Charges $OT Visit: 1 Visit OT Evaluation $OT Eval Low Complexity: 1 Low    Galen Manila 10/17/2017, 1:41 PM

## 2017-10-17 NOTE — Progress Notes (Signed)
Physical Therapy Treatment Patient Details Name: Catherine Reed MRN: [id number] DOB: 12-Nov-1950 Today's Date: 10/17/2017    History of Present Illness Catherine Reed is a 67 y.o. F s/p R TKR. PMH includes OA, Arthritis, GERD, HLD, HTN, Splenic Artery Aneurysm, Benign Breast Tumor + Removal, Hiatal Hernia, Anxiety, and Depression.    PT Comments    Pt reports increased pain this PM but agreeable to work with therapy. Completed HEP training and continued ambulation. Pt ambulation distance limited this session secondary to pain and nausea. Plan for stairs next session.   Follow Up Recommendations  Follow surgeon's recommendation for DC plan and follow-up therapies;Supervision for mobility/OOB     Equipment Recommendations  3in1 (PT)    Recommendations for Other Services OT consult     Precautions / Restrictions Precautions Precautions: Knee Precaution Booklet Issued: Yes (comment) Precaution Comments: reviewed knee precautions Restrictions Weight Bearing Restrictions: Yes RLE Weight Bearing: Weight bearing as tolerated    Mobility  Bed Mobility Overal bed mobility: Needs Assistance Bed Mobility: Supine to Sit;Sit to Supine     Supine to sit: Supervision Sit to supine: Min guard   General bed mobility comments: Pt with increased pain this session requiring increased time and effort for bed mobilities  Transfers Overall transfer level: Needs assistance Equipment used: Rolling walker (2 wheeled) Transfers: Sit to/from Stand Sit to Stand: Min assist         General transfer comment: min A to stabalize when rising from EOB and recliner chair. Poor eccentric control when lowering onto bed.  Ambulation/Gait Ambulation/Gait assistance: Min guard Gait Distance (Feet): 130 Feet Assistive device: Rolling walker (2 wheeled) Gait Pattern/deviations: Decreased stance time - right;Decreased step length - left;Decreased stride length;Decreased weight shift to right;Trunk  flexed;Antalgic;Step-through pattern Gait velocity: decreased   General Gait Details: Pt with mildly antlagic gait. Cues for increased step length and WB on LLE, along with postural control. Distance limited this session by pain and nausea.   Stairs             Wheelchair Mobility    Modified Rankin (Stroke Patients Only)       Balance Overall balance assessment: Needs assistance Sitting-balance support: Feet supported;No upper extremity supported Sitting balance-Leahy Scale: Fair Sitting balance - Comments: Pt was able to raise arms overhead while PT put on gait belt.    Standing balance support: Bilateral upper extremity supported;During functional activity Standing balance-Leahy Scale: Poor Standing balance comment: reliant on UE support                            Cognition Arousal/Alertness: Awake/alert Behavior During Therapy: WFL for tasks assessed/performed Overall Cognitive Status: Within Functional Limits for tasks assessed                                        Exercises Total Joint Exercises Heel Slides: AROM;Right;10 reps;Seated Long Arc Quad: AROM;Right;10 reps;Seated Knee Flexion: AROM;Right;Seated;5 reps(10 sec holds)    General Comments        Pertinent Vitals/Pain Pain Assessment: 0-10 Pain Score: 8  Pain Location: R Knee Pain Descriptors / Indicators: Grimacing;Guarding Pain Intervention(s): Monitored during session;Limited activity within patient's tolerance;Repositioned;Ice applied;Premedicated before session    Home Living Family/patient expects to be discharged to:: Private residence Living Arrangements: Spouse/significant other Available Help at Discharge: Family;Available 24 hours/day Type of Home: House Home Access:  Stairs to enter Entrance Stairs-Rails: None;Left Home Layout: One level Home Equipment: Walker - 2 wheels;Cane - single point;Cane - quad;Hand held shower head;Grab bars - tub/shower;Shower  seat Additional Comments: Pt reports she plans on using the front door, unless PT recommends using the side door with rails.     Prior Function Level of Independence: Independent;Independent with assistive device(s)      Comments: Pt reports using cane for uneven ground or longer distances   PT Goals (current goals can now be found in the care plan section) Acute Rehab PT Goals Patient Stated Goal: To get on the floor and play with grandchildren PT Goal Formulation: With patient Time For Goal Achievement: 10/30/17 Potential to Achieve Goals: Good Progress towards PT goals: Progressing toward goals    Frequency    7X/week      PT Plan Current plan remains appropriate    Co-evaluation              AM-PAC PT "6 Clicks" Daily Activity  Outcome Measure  Difficulty turning over in bed (including adjusting bedclothes, sheets and blankets)?: A Little Difficulty moving from lying on back to sitting on the side of the bed? : A Little Difficulty sitting down on and standing up from a chair with arms (e.g., wheelchair, bedside commode, etc,.)?: Unable Help needed moving to and from a bed to chair (including a wheelchair)?: A Little Help needed walking in hospital room?: A Little Help needed climbing 3-5 steps with a railing? : A Lot 6 Click Score: 15    End of Session Equipment Utilized During Treatment: Gait belt Activity Tolerance: Patient limited by pain(and nausea) Patient left: with call bell/phone within reach;with family/visitor present;in bed Nurse Communication: Mobility status PT Visit Diagnosis: Unsteadiness on feet (R26.81);Other abnormalities of gait and mobility (R26.89);Muscle weakness (generalized) (M62.81);Difficulty in walking, not elsewhere classified (R26.2);Pain Pain - Right/Left: Right Pain - part of body: Knee     Time: 6962-9528 PT Time Calculation (min) (ACUTE ONLY): 38 min  Charges:  $Gait Training: 23-37 mins $Therapeutic Exercise: 8-22  mins                     Kallie Locks, [place] Pager [phone] Acute Rehab   Sheral Apley 10/17/2017, 4:46 PM

## 2017-10-17 NOTE — Plan of Care (Signed)
  Problem: Activity: Goal: Risk for activity intolerance will decrease Outcome: Progressing   Problem: Pain Managment: Goal: General experience of comfort will improve Outcome: Progressing   Problem: Safety: Goal: Ability to remain free from injury will improve Outcome: Progressing   

## 2017-10-17 NOTE — Progress Notes (Signed)
Orthopedics Progress Note  Subjective: Pain controlled, patient doing well this AM  Objective:  Vitals:   10/17/17 0039 10/17/17 0424  BP: 115/73 120/74  Pulse: 72 68  Resp:    Temp: 98.6 F (37 C) 97.7 F (36.5 C)  SpO2: 97% 97%    General: Awake and alert  Musculoskeletal: Right knee dressing CDI, great quad set, and heel slide Neurovascularly intact  Lab Results  Component Value Date   WBC 12.6 (H) 10/17/2017   HGB 10.5 (L) 10/17/2017   HCT 33.0 (L) 10/17/2017   MCV 88.7 10/17/2017   PLT 239 10/17/2017       Component Value Date/Time   NA 135 10/17/2017 0656   K 3.9 10/17/2017 0656   CL 102 10/17/2017 0656   CO2 25 10/17/2017 0656   GLUCOSE 119 (H) 10/17/2017 0656   BUN 10 10/17/2017 0656   CREATININE 0.62 10/17/2017 0656   CALCIUM 9.2 10/17/2017 0656   GFRNONAA >60 10/17/2017 0656   GFRAA >60 10/17/2017 0656    No results found for: INR, PROTIME  Assessment/Plan: POD #1 s/p Procedure(s): TOTAL KNEE ARTHROPLASTY PT, OT OOB DVT prophylaxis  Viviann SpareSteven R. Ranell PatrickNorris, MD 10/17/2017 10:06 AM

## 2017-10-17 NOTE — Progress Notes (Signed)
Physical Therapy Treatment Patient Details Name: Catherine Reed MRN: [id number] DOB: 08/01/1950 Today's Date: 10/17/2017    History of Present Illness [name] is a 67 y.o. F s/p R TKR. PMH includes OA, Arthritis, GERD, HLD, HTN, Splenic Artery Aneurysm, Benign Breast Tumor + Removal, Hiatal Hernia, Anxiety, and Depression.    PT Comments    Pt able to tolerate increased ambulation distance during today's session. Completed supine HEP training. Plan to train on seated HEP and steps next session.    Follow Up Recommendations  Follow surgeon's recommendation for DC plan and follow-up therapies;Supervision for mobility/OOB     Equipment Recommendations  3in1 (PT)    Recommendations for Other Services OT consult     Precautions / Restrictions Precautions Precautions: Knee Precaution Booklet Issued: Yes (comment) Precaution Comments: Reviewed supine HEP Restrictions Weight Bearing Restrictions: Yes RLE Weight Bearing: Weight bearing as tolerated    Mobility  Bed Mobility Overal bed mobility: Needs Assistance             General bed mobility comments: Returing from restroom with OT on arrival  Transfers Overall transfer level: Needs assistance Equipment used: Rolling walker (2 wheeled) Transfers: Sit to/from Stand Sit to Stand: Min assist         General transfer comment: min guard for safety and cues for hand placement when performing sit<>stand from recliner.  Ambulation/Gait Ambulation/Gait assistance: Min guard Gait Distance (Feet): 200 Feet Assistive device: Rolling walker (2 wheeled) Gait Pattern/deviations: Step-to pattern;Decreased stance time - right;Decreased step length - left;Decreased stride length;Decreased weight shift to right;Trunk flexed;Antalgic;Step-through pattern Gait velocity: decreased   General Gait Details: Pt initially with step to pattern, able to progress to step through with cues for equal step length and increased WB on LLE.  Cues for postural control as pt tends to perform cervial flexion.   Stairs             Wheelchair Mobility    Modified Rankin (Stroke Patients Only)       Balance Overall balance assessment: Needs assistance Sitting-balance support: Feet supported;No upper extremity supported Sitting balance-Leahy Scale: Fair     Standing balance support: Bilateral upper extremity supported Standing balance-Leahy Scale: Poor Standing balance comment: reliant on UE support                            Cognition Arousal/Alertness: Awake/alert Behavior During Therapy: WFL for tasks assessed/performed Overall Cognitive Status: Within Functional Limits for tasks assessed                                        Exercises Total Joint Exercises Short Arc Quad: AROM;Right;10 reps;Seated Hip ABduction/ADduction: AROM;Right;10 reps;Seated Straight Leg Raises: AROM;Right;10 reps;Seated    General Comments General comments (skin integrity, edema, etc.): Husband present for session      Pertinent Vitals/Pain Pain Assessment: 0-10 Pain Score: 4  Pain Location: R Knee Pain Descriptors / Indicators: Grimacing;Guarding Pain Intervention(s): Limited activity within patient's tolerance;Monitored during session    Home Living                      Prior Function            PT Goals (current goals can now be found in the care plan section) Acute Rehab PT Goals Patient Stated Goal: To get on the floor and  play with grandchildren PT Goal Formulation: With patient Time For Goal Achievement: 10/30/17 Potential to Achieve Goals: Good Progress towards PT goals: Progressing toward goals    Frequency    7X/week      PT Plan Current plan remains appropriate    Co-evaluation              AM-PAC PT "6 Clicks" Daily Activity  Outcome Measure  Difficulty turning over in bed (including adjusting bedclothes, sheets and blankets)?: A Little Difficulty  moving from lying on back to sitting on the side of the bed? : A Little Difficulty sitting down on and standing up from a chair with arms (e.g., wheelchair, bedside commode, etc,.)?: Unable Help needed moving to and from a bed to chair (including a wheelchair)?: A Little Help needed walking in hospital room?: A Little Help needed climbing 3-5 steps with a railing? : A Lot 6 Click Score: 15    End of Session Equipment Utilized During Treatment: Gait belt Activity Tolerance: Patient tolerated treatment well Patient left: in chair;with call bell/phone within reach;with family/visitor present Nurse Communication: Mobility status PT Visit Diagnosis: Unsteadiness on feet (R26.81);Other abnormalities of gait and mobility (R26.89);Muscle weakness (generalized) (M62.81);Difficulty in walking, not elsewhere classified (R26.2);Pain Pain - Right/Left: Right Pain - part of body: Knee     Time: 1130-1158 PT Time Calculation (min) (ACUTE ONLY): 28 min  Charges:  $Gait Training: 8-22 mins $Therapeutic Exercise: 8-22 mins                     Kallie Locks, [place] Pager [phone] Acute Rehab   Sheral Apley 10/17/2017, 12:06 PM

## 2017-10-17 NOTE — Care Management Note (Signed)
Case Management Note  Patient Details  Name: Catherine Reed MRN: [id number] Date of Birth: 1950/10/21  Subjective/Objective: 67 yr old female s/p right total knee total knee arthroplasty.                   Action/Plan: Case manager spoke with patient concerning discharge plan and DME. Patient was preoperatively setup with [facility], no changes.. Patient will have family support at discharge.   Expected Discharge Date:   10/18/17               Expected Discharge Plan:  Home w Home Health Services  In-House Referral:  NA  Discharge planning Services  CM Consult  Post Acute Care Choice:  Home Health Choice offered to:  Patient  DME Arranged:  (has RW, borrowing 3in1) DME Agency:  NA  HH Arranged:  PT HH Agency:  [facility]  Status of Service:  Completed, signed off  If discussed at Long Length of Stay Meetings, dates discussed:    Additional Comments:  Catherine Reed, Catherine Delahunt [name], RN 10/17/2017, 10:55 AM

## 2017-10-18 LAB — CBC
HCT: 32.3 % — ABNORMAL LOW (ref 36.0–46.0)
Hemoglobin: 10.5 g/dL — ABNORMAL LOW (ref 12.0–15.0)
MCH: 28.5 pg (ref 26.0–34.0)
MCHC: 32.5 g/dL (ref 30.0–36.0)
MCV: 87.8 fL (ref 78.0–100.0)
PLATELETS: 212 10*3/uL (ref 150–400)
RBC: 3.68 MIL/uL — AB (ref 3.87–5.11)
RDW: 14.4 % (ref 11.5–15.5)
WBC: 9.9 10*3/uL (ref 4.0–10.5)

## 2017-10-18 MED ORDER — BISACODYL 10 MG RE SUPP
10.0000 mg | Freq: Once | RECTAL | Status: AC
  Administered 2017-10-18: 10 mg via RECTAL
  Filled 2017-10-18: qty 1

## 2017-10-18 NOTE — Discharge Summary (Signed)
Orthopedic Discharge Summary        Physician Discharge Summary  Patient ID: Catherine Reed MRN: [id number] DOB/AGE: 68-Jun-1952 68 y.o.  Admit date: 10/16/2017 Discharge date: 10/18/2017   Procedures:  Procedure(s) (LRB): TOTAL KNEE ARTHROPLASTY (Right)  Attending Physician:  Dr. Malon Kindle  Admission Diagnoses:   Right knee arthritis, primary, end staged  Discharge Diagnoses:  Right knee arthritis, primary, end staged   Past Medical History:  Diagnosis Date  . Abdominal pain, unspecified site   . Anemia   . Anxiety   . Arthritis   . Depression   . GERD (gastroesophageal reflux disease)   . History of hiatal hernia   . History of IBS   . Hyperlipidemia   . Hypertension   . Pre-diabetes   . Splenic artery aneurysm [facility])     PCP: Georga Kaufmann, MD   Discharged Condition: good  Hospital Course:  Patient underwent the above stated procedure on 10/16/2017. Patient tolerated the procedure well and brought to the recovery room in good condition and subsequently to the floor. Patient had an uncomplicated hospital course and was stable for discharge.   Disposition: Discharge disposition: 01-Home or Self Care      with follow up in 2 weeks   Follow-up Information    Beverely Low, MD. Call in 2 weeks.   Specialty:  Orthopedic Surgery Why:  [phone] Contact information: [address] [place] [address] [phone]        [facility].   Specialty:  Home Health Services Why:  A representative from [facility] will contact you to arrange start date and time for your therapy. Contact information: [address] [place] [address] [phone]           Discharge Instructions    Call MD / Call 911   Complete by:  As directed    If you experience chest pain or shortness of breath, CALL 911 and be transported to the hospital emergency room.  If you develope a fever above 101 F, pus (white  drainage) or increased drainage or redness at the wound, or calf pain, call your surgeon's office.   Constipation Prevention   Complete by:  As directed    Drink plenty of fluids.  Prune juice may be helpful.  You may use a stool softener, such as Colace (over the counter) 100 mg twice a day.  Use MiraLax (over the counter) for constipation as needed.   Diet - low sodium heart healthy   Complete by:  As directed    Do not put a pillow under the knee. Place it under the heel.   Complete by:  As directed    Increase activity slowly as tolerated   Complete by:  As directed    TED hose   Complete by:  As directed    Use stockings (TED hose) for 4weeks on both leg(s).  You may remove them to bathe and for laundering      Allergies as of 10/18/2017      Reactions   Polycarbophil Swelling   SWELLING REACTION UNSPECIFIED  [SEVERITY CHARACTERIZED FROM PMH]   Celecoxib Nausea Only   Hctz [hydrochlorothiazide] Itching, Rash   Sulfamethizole Itching, Rash   Sulfamethoxazole-trimethoprim Itching   Trimethoprim Itching, Rash      Medication List    STOP taking these medications   HYDROcodone-acetaminophen 5-325 MG tablet Commonly known as:  NORCO/VICODIN  TAKE these medications   amLODipine 5 MG tablet Commonly known as:  NORVASC Take 5 mg by mouth daily before supper.   aspirin 81 MG chewable tablet Commonly known as:  ASPIRIN CHILDRENS Chew 1 tablet (81 mg total) by mouth 2 (two) times daily.   calcium-vitamin D 500-200 MG-UNIT tablet Commonly known as:  OSCAL WITH D Take 2 tablets by mouth at bedtime.   cetirizine 10 MG tablet Commonly known as:  ZYRTEC Take 10 mg by mouth at bedtime.   diphenoxylate-atropine 2.5-0.025 MG tablet Commonly known as:  LOMOTIL Take 1 tablet by mouth 2 (two) times daily as needed for diarrhea or loose stools.   mesalamine 1.2 g EC tablet Commonly known as:  LIALDA Take 4.8 g by mouth daily with breakfast.   methocarbamol 500 MG  tablet Commonly known as:  ROBAXIN Take 1 tablet (500 mg total) by mouth 3 (three) times daily as needed.   montelukast 10 MG tablet Commonly known as:  SINGULAIR TAKE ONE TABLET ONCE DAILY What changed:    how much to take  how to take this  when to take this  additional instructions   multivitamin with minerals Tabs tablet Take 1 tablet by mouth at bedtime.   olmesartan 40 MG tablet Commonly known as:  BENICAR Take 40 mg by mouth every evening.   omeprazole 40 MG capsule Commonly known as:  PRILOSEC Take 40 mg by mouth daily before supper.   oxyCODONE-acetaminophen 5-325 MG tablet Commonly known as:  PERCOCET Take 1-2 tablets by mouth every 4 (four) hours as needed for moderate pain or severe pain.   sertraline 100 MG tablet Commonly known as:  ZOLOFT Take 100 mg by mouth every evening.   simvastatin 20 MG tablet Commonly known as:  ZOCOR Take 20 mg by mouth at bedtime.            Durable Medical Equipment  (From admission, onward)        Start     Ordered   10/16/17 1440  DME Walker rolling  Once    Question:  Patient needs a walker to treat with the following condition  Answer:  Status post total knee replacement, right   10/16/17 1439   10/16/17 1440  DME 3 n 1  Once     10/16/17 1439        Signed: Iasiah Ozment,STEVEN R 10/18/2017, 8:32 AM  [facility]  [place] [address], [place], [place] [address] Phone: [phone] Facebook  [employer]  LinkedIn  Twitter

## 2017-10-18 NOTE — Progress Notes (Signed)
Physical Therapy Treatment Patient Details Name: Catherine Reed MRN: [id number] DOB: 09/18/1950 Today's Date: 10/18/2017    History of Present Illness Catherine Reed is a 67 y.o. F s/p R TKR. PMH includes OA, Arthritis, GERD, HLD, HTN, Splenic Artery Aneurysm, Benign Breast Tumor + Removal, Hiatal Hernia, Anxiety, and Depression.    PT Comments    Patient is progressing very well towards their physical therapy goals. Session focused on continued gait and stair training. Ambulating 80 feet with walker and supervision. Negotiated 2 steps with railing and a step without a railing using a walker to prepare for discharge home. Goniometric ROM seated: 0-90 degrees flexion.    Follow Up Recommendations  Follow surgeon's recommendation for DC plan and follow-up therapies;Supervision for mobility/OOB     Equipment Recommendations  3in1 (PT)    Recommendations for Other Services       Precautions / Restrictions Precautions Precautions: Knee Precaution Booklet Issued: Yes (comment) Restrictions Weight Bearing Restrictions: Yes RLE Weight Bearing: Weight bearing as tolerated    Mobility  Bed Mobility Overal bed mobility: Modified Independent Bed Mobility: Sit to Supine       Sit to supine: Modified independent (Device/Increase time)   General bed mobility comments: increased time and effort for sit to supine but able to achieve without physical assistance  Transfers Overall transfer level: Needs assistance Equipment used: Rolling walker (2 wheeled) Transfers: Sit to/from Stand Sit to Stand: Min assist;Min guard         General transfer comment: up to min assist to boost up from recliner but mainly required min guard assist. increased time to achieve upright positioning. decreased eccentric control with transition from stand to sit  Ambulation/Gait Ambulation/Gait assistance: Supervision Gait Distance (Feet): 80 Feet Assistive device: Rolling walker (2 wheeled) Gait  Pattern/deviations: Decreased stance time - right;Decreased step length - left;Decreased stride length;Decreased weight shift to right;Antalgic;Step-through pattern;Step-to pattern Gait velocity: decreased   General Gait Details: Patient able to progress to step through pattern with cueing. Verbal cues provided for decreased right step length. Slow and steady gait.   Stairs Stairs: Yes Stairs assistance: Min guard Stair Management: Two rails;With walker;No rails Number of Stairs: 3 General stair comments: Negotiated 2 steps with bilateral railings and 1 step with no railings using walker. able to self cue for technique. Elevated RLE via cirumduction   Wheelchair Mobility    Modified Rankin (Stroke Patients Only)       Balance Overall balance assessment: Needs assistance Sitting-balance support: Feet supported;No upper extremity supported Sitting balance-Leahy Scale: Fair     Standing balance support: Bilateral upper extremity supported;During functional activity Standing balance-Leahy Scale: Poor Standing balance comment: reliant on UE support                            Cognition Arousal/Alertness: Awake/alert Behavior During Therapy: WFL for tasks assessed/performed Overall Cognitive Status: Within Functional Limits for tasks assessed                                        Exercises Total Joint Exercises Heel Slides: AROM;Right;Seated;5 reps Knee Flexion: (10 sec holds) Goniometric ROM: Seated: 0-90 degrees flexion    General Comments        Pertinent Vitals/Pain Pain Assessment: Faces Faces Pain Scale: Hurts little more Pain Location: R Knee Pain Descriptors / Indicators: Grimacing;Guarding Pain Intervention(s): Monitored during  session    Home Living                      Prior Function            PT Goals (current goals can now be found in the care plan section) Acute Rehab PT Goals Patient Stated Goal: To get on  the floor and play with grandchildren PT Goal Formulation: With patient Time For Goal Achievement: 10/30/17 Potential to Achieve Goals: Good Progress towards PT goals: Progressing toward goals    Frequency    7X/week      PT Plan Current plan remains appropriate    Co-evaluation              AM-PAC PT "6 Clicks" Daily Activity  Outcome Measure  Difficulty turning over in bed (including adjusting bedclothes, sheets and blankets)?: A Little Difficulty moving from lying on back to sitting on the side of the bed? : A Little Difficulty sitting down on and standing up from a chair with arms (e.g., wheelchair, bedside commode, etc,.)?: Unable Help needed moving to and from a bed to chair (including a wheelchair)?: A Little Help needed walking in hospital room?: A Little Help needed climbing 3-5 steps with a railing? : A Lot 6 Click Score: 15    End of Session Equipment Utilized During Treatment: Gait belt Activity Tolerance: Patient limited by pain(and nausea) Patient left: with call bell/phone within reach;with family/visitor present;in bed Nurse Communication: Mobility status PT Visit Diagnosis: Unsteadiness on feet (R26.81);Other abnormalities of gait and mobility (R26.89);Muscle weakness (generalized) (M62.81);Difficulty in walking, not elsewhere classified (R26.2);Pain Pain - Right/Left: Right Pain - part of body: Knee     Time: 0950-1010 PT Time Calculation (min) (ACUTE ONLY): 20 min  Charges:  $Gait Training: 8-22 mins                    Laurina Bustlearoline Akeiba Axelson, PT, DPT Acute Rehabilitation Services  Pager: [phone]    Vanetta MuldersCarloine H Aeralyn Barna 10/18/2017, 10:27 AM

## 2017-10-18 NOTE — Progress Notes (Signed)
Orthopedics Progress Note  Subjective: Doing well this morning. Pain controlled.  Objective:  Vitals:   10/17/17 1955 10/18/17 0358  BP: (!) 157/82 132/80  Pulse: 84 76  Resp: 17 17  Temp: 98.8 F (37.1 C) 98.7 F (37.1 C)  SpO2: 96% 98%    General: Awake and alert  Musculoskeletal: Right knee dressing changed, no erythema, minimal swelling. Neurovascularly intact  Lab Results  Component Value Date   WBC 9.9 10/18/2017   HGB 10.5 (L) 10/18/2017   HCT 32.3 (L) 10/18/2017   MCV 87.8 10/18/2017   PLT 212 10/18/2017       Component Value Date/Time   NA 135 10/17/2017 0656   K 3.9 10/17/2017 0656   CL 102 10/17/2017 0656   CO2 25 10/17/2017 0656   GLUCOSE 119 (H) 10/17/2017 0656   BUN 10 10/17/2017 0656   CREATININE 0.62 10/17/2017 0656   CALCIUM 9.2 10/17/2017 0656   GFRNONAA >60 10/17/2017 0656   GFRAA >60 10/17/2017 0656    No results found for: INR, PROTIME  Assessment/Plan: POD #2 s/p Procedure(s): TOTAL KNEE ARTHROPLASTY OOB with therapy, continue PT, OT D/C to home this afternoon after PTand also after BM Make sure HH PT, OT arrangements made  [employer] CompanySteven R. Ranell PatrickNorris, MD 10/18/2017 8:28 AM

## 2017-10-18 NOTE — Progress Notes (Signed)
3/1 requested by patient, order placed, will be delivered to room today

## 2017-10-18 NOTE — Progress Notes (Signed)
Written and verbal instructions provided to the patient.  The patient and her husband verbalizes understanding those instructions and the follow up.  Discharged to the home via wheel chair.

## 2017-10-18 NOTE — Progress Notes (Signed)
Occupational Therapy Treatment Patient Details Name: Catherine Reed MRN: [id number] DOB: 11-01-1950 Today's Date: 10/18/2017    History of present illness Catherine Reed is a 67 y.o. F s/p R TKA. PMH includes OA, Arthritis, GERD, HLD, HTN, Splenic Artery Aneurysm, Benign Breast Tumor + Removal, Hiatal Hernia, Anxiety, and Depression.   OT comments  Pt progressing rewards established OT goals. Pt performing shower transfer with Min Guard A demonstrating good understanding of safe technique. Reviewed LB dressing and toilet transfers; pt verbalize understanding. Pt demonstrating good functional progress. Answered all questions in preparation for dc home later today. Continue to recommend dc home. All acute OT needs met and will sign off.    Follow Up Recommendations  Follow surgeon's recommendation for DC plan and follow-up therapies;Supervision - Intermittent    Equipment Recommendations  3 in 1 bedside commode    Recommendations for Other Services PT consult    Precautions / Restrictions Precautions Precautions: Knee Precaution Booklet Issued: Yes (comment) Precaution Comments: reviewed knee precautions Required Braces or Orthoses: Knee Immobilizer - Right Restrictions Weight Bearing Restrictions: Yes RLE Weight Bearing: Weight bearing as tolerated       Mobility Bed Mobility Overal bed mobility: Modified Independent Bed Mobility: Sit to Supine       Sit to supine: Modified independent (Device/Increase time)   General bed mobility comments: Increased time  Transfers Overall transfer level: Needs assistance Equipment used: Rolling walker (2 wheeled) Transfers: Sit to/from Stand Sit to Stand: Min guard         General transfer comment: Min Guard A for safety    Balance Overall balance assessment: Needs assistance Sitting-balance support: Feet supported;No upper extremity supported Sitting balance-Leahy Scale: Good     Standing balance support: Bilateral upper  extremity supported;During functional activity Standing balance-Leahy Scale: Poor Standing balance comment: reliant on UE support                           ADL either performed or assessed with clinical judgement   ADL Overall ADL's : Needs assistance/impaired                     Lower Body Dressing: Minimal assistance;Sit to/from stand Lower Body Dressing Details (indicate cue type and reason): Pt able to adjust right socks while seated at EOB demonstating increased ROM for bending forward at hops. Pt requiring assistance for RLE and dyanmic standing balance. Reviewed compensatory tehcniques to don pants on RLE first. Toilet Transfer: Min guard;Ambulation;RW(Simulated to chair) Toilet Transfer Details (indicate cue type and reason): MinGuard A for safety     Tub/ Shower Transfer: Walk-in shower;Min guard;Ambulation;Shower Technical sales engineer Details (indicate cue type and reason): Educating pt on shower transfer safety. Pt demonstrating understanding and performing transfer with Min guard A for safety Functional mobility during ADLs: Min guard;Rolling walker General ADL Comments: Pt demosntrating good progressed. Focused on LB ADLs and shower transfer in preparation for dc home today     Vision       Perception     Praxis      Cognition Arousal/Alertness: Awake/alert Behavior During Therapy: WFL for tasks assessed/performed Overall Cognitive Status: Within Functional Limits for tasks assessed                                          Exercises    Shoulder  Instructions       General Comments      Pertinent Vitals/ Pain       Pain Assessment: Faces Faces Pain Scale: Hurts little more Pain Location: R Knee Pain Descriptors / Indicators: Grimacing;Guarding Pain Intervention(s): Monitored during session;Limited activity within patient's tolerance;Repositioned  Home Living                                           Prior Functioning/Environment              Frequency  Min 2X/week        Progress Toward Goals  OT Goals(current goals can now be found in the care plan section)  Progress towards OT goals: Progressing toward goals  Acute Rehab OT Goals Patient Stated Goal: To get on the floor and play with grandchildren OT Goal Formulation: With patient/family Time For Goal Achievement: 10/24/17 Potential to Achieve Goals: Good ADL Goals Pt Will Perform Grooming: with set-up;with supervision;standing;with caregiver independent in assisting Pt Will Perform Lower Body Bathing: with min guard assist;with caregiver independent in assisting Pt Will Perform Lower Body Dressing: with min guard assist Pt Will Transfer to Toilet: with supervision;ambulating Pt Will Perform Toileting - Clothing Manipulation and hygiene: with supervision;with caregiver independent in assisting;sit to/from stand Pt Will Perform Tub/Shower Transfer: with min guard assist;with supervision;ambulating;shower seat;3 in 1;grab bars;rolling walker  Plan Discharge plan remains appropriate    Co-evaluation                 AM-PAC PT "6 Clicks" Daily Activity     Outcome Measure   Help from another person eating meals?: None Help from another person taking care of personal grooming?: A Little Help from another person toileting, which includes using toliet, bedpan, or urinal?: A Little Help from another person bathing (including washing, rinsing, drying)?: A Little Help from another person to put on and taking off regular upper body clothing?: None Help from another person to put on and taking off regular lower body clothing?: A Little 6 Click Score: 20    End of Session Equipment Utilized During Treatment: Gait belt;Rolling walker  OT Visit Diagnosis: Unsteadiness on feet (R26.81);Other abnormalities of gait and mobility (R26.89);Muscle weakness (generalized) (M62.81);Pain Pain - Right/Left:  Right Pain - part of body: Knee   Activity Tolerance Patient tolerated treatment well   Patient Left in chair;with call bell/phone within reach(with PT)   Nurse Communication Mobility status;Precautions;Weight bearing status        Time: 2473-1924 OT Time Calculation (min): 15 min  Charges: OT General Charges $OT Visit: 1 Visit OT Treatments $Self Care/Home Management : 8-22 mins  Thaine Garriga MSOT, OTR/L Acute Rehab Pager: [phone] Office: [place] 10/18/2017, 10:48 AM

## 2017-10-19 ENCOUNTER — Encounter (HOSPITAL_COMMUNITY): Payer: Self-pay | Admitting: Orthopedic Surgery

## 2019-12-21 ENCOUNTER — Telehealth: Payer: Self-pay
